# Patient Record
Sex: Female | Born: 1995
Health system: Southern US, Community
[De-identification: ages and names within clinical notes are randomized; demographics above are authoritative.]

## PROBLEM LIST (undated history)

## (undated) ENCOUNTER — Inpatient Hospital Stay (HOSPITAL_COMMUNITY): Payer: Self-pay

## (undated) DIAGNOSIS — J189 Pneumonia, unspecified organism: Secondary | ICD-10-CM

## (undated) DIAGNOSIS — R51 Headache: Secondary | ICD-10-CM

## (undated) DIAGNOSIS — R519 Headache, unspecified: Secondary | ICD-10-CM

## (undated) HISTORY — PX: ORIF TIBIA FRACTURE: SHX5416

## (undated) HISTORY — PX: MOUTH SURGERY: SHX715

---
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
## 2013-02-25 ENCOUNTER — Emergency Department (HOSPITAL_BASED_OUTPATIENT_CLINIC_OR_DEPARTMENT_OTHER)
Admission: EM | Admit: 2013-02-25 | Discharge: 2013-02-26 | Disposition: A | Discharge status: Home / Self Care | Payer: Medicaid Other | Attending: Emergency Medicine | Admitting: Emergency Medicine

## 2013-02-25 ENCOUNTER — Encounter (HOSPITAL_BASED_OUTPATIENT_CLINIC_OR_DEPARTMENT_OTHER): Payer: Self-pay | Admitting: *Deleted

## 2013-02-25 DIAGNOSIS — R51 Headache: Secondary | ICD-10-CM | POA: Insufficient documentation

## 2013-02-25 DIAGNOSIS — R05 Cough: Secondary | ICD-10-CM | POA: Insufficient documentation

## 2013-02-25 DIAGNOSIS — J45909 Unspecified asthma, uncomplicated: Secondary | ICD-10-CM | POA: Insufficient documentation

## 2013-02-25 DIAGNOSIS — Z3202 Encounter for pregnancy test, result negative: Secondary | ICD-10-CM | POA: Insufficient documentation

## 2013-02-25 DIAGNOSIS — R0981 Nasal congestion: Secondary | ICD-10-CM

## 2013-02-25 DIAGNOSIS — B349 Viral infection, unspecified: Secondary | ICD-10-CM

## 2013-02-25 DIAGNOSIS — L509 Urticaria, unspecified: Secondary | ICD-10-CM | POA: Insufficient documentation

## 2013-02-25 DIAGNOSIS — Z79899 Other long term (current) drug therapy: Secondary | ICD-10-CM | POA: Insufficient documentation

## 2013-02-25 DIAGNOSIS — B9789 Other viral agents as the cause of diseases classified elsewhere: Secondary | ICD-10-CM | POA: Insufficient documentation

## 2013-02-25 DIAGNOSIS — J3489 Other specified disorders of nose and nasal sinuses: Secondary | ICD-10-CM | POA: Insufficient documentation

## 2013-02-25 DIAGNOSIS — R509 Fever, unspecified: Secondary | ICD-10-CM | POA: Insufficient documentation

## 2013-02-25 DIAGNOSIS — R093 Abnormal sputum: Secondary | ICD-10-CM | POA: Insufficient documentation

## 2013-02-25 DIAGNOSIS — R059 Cough, unspecified: Secondary | ICD-10-CM | POA: Insufficient documentation

## 2013-02-25 DIAGNOSIS — L299 Pruritus, unspecified: Secondary | ICD-10-CM | POA: Insufficient documentation

## 2013-02-25 LAB — URINALYSIS, ROUTINE W REFLEX MICROSCOPIC
Bilirubin Urine: NEGATIVE
Glucose, UA: NEGATIVE mg/dL
Hgb urine dipstick: NEGATIVE
Ketones, ur: NEGATIVE mg/dL
Leukocytes, UA: NEGATIVE
Nitrite: NEGATIVE
Protein, ur: NEGATIVE mg/dL
Specific Gravity, Urine: 1.023 (ref 1.005–1.030)
Urobilinogen, UA: 1 mg/dL (ref 0.0–1.0)
pH: 7 (ref 5.0–8.0)

## 2013-02-25 MED ORDER — DIPHENHYDRAMINE HCL 25 MG PO CAPS
25.0000 mg | ORAL_CAPSULE | Freq: Once | ORAL | Status: AC
  Administered 2013-02-25: 25 mg via ORAL
  Filled 2013-02-25: qty 1

## 2013-02-25 MED ORDER — IBUPROFEN 800 MG PO TABS: 800.0000 mg | ORAL_TABLET | Freq: Three times a day (TID) | ORAL | Status: DC

## 2013-02-25 MED ORDER — DIPHENHYDRAMINE-ZINC ACETATE 1-0.1 % EX CREA: TOPICAL_CREAM | Freq: Three times a day (TID) | CUTANEOUS | Status: DC | PRN

## 2013-02-25 MED ORDER — LIDOCAINE HCL 2 % EX GEL
Freq: Once | CUTANEOUS | Status: AC
  Administered 2013-02-25: 20 via TOPICAL
  Filled 2013-02-25: qty 20

## 2013-02-25 MED ORDER — IBUPROFEN 800 MG PO TABS
800.0000 mg | ORAL_TABLET | Freq: Once | ORAL | Status: AC
  Administered 2013-02-25: 800 mg via ORAL
  Filled 2013-02-25: qty 1

## 2013-02-25 NOTE — ED Provider Notes (Signed)
History     This chart was scribed for Latasha Skene, MD by Jiles Prows, ED Scribe. The patient was seen in room MH08/MH08 and the patient's care was started at 11:25 PM.    CSN: [id number]  Arrival date & time 02/25/13  2154   Chief Complaint  Patient presents with  . Sore Throat    The history is provided by the patient, medical records, a friend and a relative. No language interpreter was used.   HPI Comments: Latasha Decker is a 17 y.o. female who presents to the Emergency Department complaining of moderate constant sore throat that began two days ago.  She complains of productive cough with dark yellow sputum, headache, and itchy, raised, red "bite" areas on her stomach, legs, and feet.  Pt denies diaphoresis, nausea, vomiting, diarrhea, weakness, and any other pain.    Pt reports last menstrual period was 3 weeks ago. Pt reports smoking blacks (5/week) occasionally.  Past Medical History  Diagnosis Date  . Asthma     Past Surgical History  Procedure Laterality Date  . Orif tibia fracture    . Mouth surgery      Family History  Problem Relation Age of Onset  . Other Neg Hx     History  Substance Use Topics  . Smoking status: Never Smoker   . Smokeless tobacco: Never Used  . Alcohol Use: Yes     Comment: occasionally    OB History   Grav Para Term Preterm Abortions TAB SAB Ect Mult Living                  Review of Systems At least 10pt or greater review of systems completed and are negative except where specified in the HP.  Allergies  Peanuts  Home Medications   Current Outpatient Rx  Name  Route  Sig  Dispense  Refill  . ciprofloxacin (CIPRO) 500 MG tablet   Oral   Take 1 tablet (500 mg total) by mouth 2 (two) times daily.   3 tablet   0   . hydrocortisone cream 1 %      Apply to affected areas 3 times daily, for no more than 1 week at a time   15 g   0   . ibuprofen (ADVIL,MOTRIN) 800 MG tablet   Oral   Take 1 tablet (800 mg total) by  mouth 3 (three) times daily.   21 tablet   0   . phentermine 37.5 MG capsule   Oral   Take 37.5 mg by mouth every morning.         . pseudoephedrine-guaifenesin (MUCINEX D) 60-600 MG per tablet   Oral   Take 1 tablet by mouth every 12 (twelve) hours.           BP 138/65  Pulse 142  Temp(Src) 100 F (37.8 C) (Oral)  Resp 20  Ht 5\' 3"  (1.6 m)  Wt 370 lb (167.831 kg)  BMI 65.56 kg/m2  SpO2 99%  LMP 02/04/2013  Physical Exam  Nursing notes reviewed.  Electronic medical record reviewed. VITAL SIGNS:   Filed Vitals:   02/25/13 2201  BP: 138/65  Pulse: 142  Temp: 100 F (37.8 C)  TempSrc: Oral  Resp: 20  Height: 5\' 3"  (1.6 m)  Weight: 370 lb (167.831 kg)  SpO2: 99%   CONSTITUTIONAL: Awake, oriented, appears non-toxic HENT: Atraumatic, normocephalic, oral mucosa pink and moist, airway patent. Pharynx is without exudate or erythema, no swelling noted.  Nares  patent without thin clear drainage some mild turbinate swelling. External ears normal. EYES: Conjunctiva clear, EOMI, PERRLA NECK: Trachea midline, non-tender, supple CARDIOVASCULAR: Normal heart rate, Normal rhythm, No murmurs, rubs, gallops PULMONARY/CHEST: Clear to auscultation, no rhonchi, wheezes, or rales. Symmetrical breath sounds. Non-tender. ABDOMINAL: Non-distended, morbidly obese soft, non-tender - no rebound or guarding.  BS normal. NEUROLOGIC: Non-focal, moving all four extremities, no gross sensory or motor deficits. EXTREMITIES: No clubbing, cyanosis, or edema SKIN: Warm, Dry, No erythema. Patient has occasional scattered occurred O. we'll's some on the medial aspect of the right foot, some on the abdomen, these are $0.50 piece in diameter, raised macular areas they're pruritic.  ED Course  Procedures (including critical care time) DIAGNOSTIC STUDIES: Oxygen Saturation is 99% on RA, normal by my interpretation.    COORDINATION OF CARE: 11:34 PM - Discussed ED treatment with pt at bedside and pt  agrees.     Labs Reviewed  RAPID STREP SCREEN  CULTURE, GROUP A STREP  PREGNANCY, URINE  URINALYSIS, ROUTINE W REFLEX MICROSCOPIC   No results found.   1. Viral syndrome   2. Fever   3. Cough   4. Rhinorrhea   5. Nasal congestion   6. Urticaria       MDM  Patient presenting with viral syndrome and there is urticarial wheals. Patient is febrile here, she is morbidly obese, patient is a Wells score of 1 for tachycardia, but she's not short of breath not complaining about chest pain-only a cough. Do not think her tachycardia as result of pulmonary embolism or any other intrathoracic or intra-abdominal emergency at this time. I think her tachycardia secondary to her obesity and fever.  No evidence for RPA or PTA, strep swab is negative.  Patient with viral syndrome with evidence for rhinorrhea, congestion. Treat patient symptomatically.  I personally performed the services described in this documentation, which was scribed in my presence. The recorded information has been reviewed and is accurate. Latasha Decker, M.D.     Latasha Skene, MD 02/26/13 3086

## 2013-02-25 NOTE — ED Notes (Signed)
Pt c/o H/A. Sore throat, fever and chills x 2 days.

## 2013-02-26 NOTE — ED Notes (Addendum)
rx x 2 for benadryl cream and ibuprofen given

## 2013-02-28 LAB — CULTURE, GROUP A STREP

## 2013-03-01 NOTE — Progress Notes (Signed)
  ED Antimicrobial Stewardship Positive Culture Follow Up   Latasha Decker is an 17 y.o. female who presented to [facility] on 02/25/2013 with a chief complaint of sore throat, fever, chills x 2 days.  Chief Complaint  Patient presents with  . Sore Throat    Recent Results (from the past 720 hour(s))  RAPID STREP SCREEN     Status: None   Collection Time    02/25/13 10:07 PM      Result Value Range Status   Streptococcus, Group A Screen (Direct) NEGATIVE  NEGATIVE Final   Comment: (NOTE)     A Rapid Antigen test may result negative if the antigen level in the     sample is below the detection level of this test. The FDA has not     cleared this test as a stand-alone test therefore the rapid antigen     negative result has reflexed to a Group A Strep culture.  CULTURE, GROUP A STREP     Status: None   Collection Time    02/25/13 10:07 PM      Result Value Range Status   Specimen Description THROAT   Final   Special Requests NONE   Final   Culture STREPTOCOCCUS,BETA HEMOLYIC NOT GROUP A   Final   Report Status 02/28/2013 FINAL   Final    []  Treated with , organism resistant to prescribed antimicrobial [x]  Patient discharged originally without antimicrobial agent and treatment is now indicated  New antibiotic prescription: Penicillin VK 500mg  TID PO TID x 10 days  ED Provider: Jaynie Crumble, PA-C   Cleon Dew 03/01/2013, 10:47 AM Infectious Diseases Pharmacist Phone# [phone]

## 2013-03-01 NOTE — ED Notes (Addendum)
Post ED Visit - Positive Culture Follow-up: Successful Patient Follow-Up  Culture assessed and recommendations reviewed by: [x]  Wes Dulaney, Pharm.D., BCPS []  Celedonio Miyamoto, Pharm.D., BCPS []  Georgina Pillion, [address].D., BCPS []  [place], [address].D., BCPS, AAHIVP []  Estella Husk, Pharm.D., BCPS, AAHIVP  Positive group a strep culture  [x]  Patient discharged without antimicrobial prescription and treatment is now indicated []  Organism is resistant to prescribed ED discharge antimicrobial []  Patient with positive blood cultures  Changes discussed with ED provider: Jaynie Crumble New antibiotic prescription Penicillin VK 500 mg po TID x 10 days  Patient  Informed of positve results,rx called to [facility] [phone] by Sheralyn Boatman PFM     Larena Sox 03/01/2013, 12:20 PM

## 2013-03-02 ENCOUNTER — Telehealth (HOSPITAL_COMMUNITY): Payer: Self-pay | Admitting: Emergency Medicine

## 2013-03-07 ENCOUNTER — Emergency Department (HOSPITAL_BASED_OUTPATIENT_CLINIC_OR_DEPARTMENT_OTHER)
Admission: EM | Admit: 2013-03-07 | Discharge: 2013-03-07 | Disposition: A | Discharge status: Home / Self Care | Payer: Medicaid Other | Attending: Emergency Medicine | Admitting: Emergency Medicine

## 2013-03-07 ENCOUNTER — Encounter (HOSPITAL_BASED_OUTPATIENT_CLINIC_OR_DEPARTMENT_OTHER): Payer: Self-pay

## 2013-03-07 DIAGNOSIS — F17200 Nicotine dependence, unspecified, uncomplicated: Secondary | ICD-10-CM | POA: Insufficient documentation

## 2013-03-07 DIAGNOSIS — Z8781 Personal history of (healed) traumatic fracture: Secondary | ICD-10-CM | POA: Insufficient documentation

## 2013-03-07 DIAGNOSIS — Z792 Long term (current) use of antibiotics: Secondary | ICD-10-CM | POA: Insufficient documentation

## 2013-03-07 DIAGNOSIS — R21 Rash and other nonspecific skin eruption: Secondary | ICD-10-CM

## 2013-03-07 DIAGNOSIS — Z79899 Other long term (current) drug therapy: Secondary | ICD-10-CM | POA: Insufficient documentation

## 2013-03-07 DIAGNOSIS — J45909 Unspecified asthma, uncomplicated: Secondary | ICD-10-CM | POA: Insufficient documentation

## 2013-03-07 HISTORY — DX: Morbid (severe) obesity due to excess calories: E66.01

## 2013-03-07 MED ORDER — HYDROXYZINE HCL 25 MG PO TABS: 25.0000 mg | ORAL_TABLET | Freq: Four times a day (QID) | ORAL | Status: DC | PRN

## 2013-03-07 NOTE — ED Provider Notes (Signed)
History   CSN: [id number] Arrival date & time 03/07/13  2143 First MD Initiated Contact with Patient 03/07/13 2238      Chief Complaint  Patient presents with  . Urticaria    HPI The patient has noticed a rash on her left arm ongoing for last few days. Patient states she is not sure she is getting bitten by some type of bug at night. No one else in the family has a similar rash. She has not seen any bugs. Patient denies any fevers chills cough vomiting or diarrhea. She has not noticed any tick bites. She's not had any difficulty breathing. Past Medical History  Diagnosis Date  . Asthma   . Morbid obesity     Past Surgical History  Procedure Laterality Date  . Orif tibia fracture    . Mouth surgery      Family History  Problem Relation Age of Onset  . Other Neg Hx     History  Substance Use Topics  . Smoking status: Current Some Day Smoker -- 0.50 packs/day  . Smokeless tobacco: Never Used  . Alcohol Use: Yes     Comment: occasionally    OB History   Grav Para Term Preterm Abortions TAB SAB Ect Mult Living                  Review of Systems  All other systems reviewed and are negative.    Allergies  Peanuts  Home Medications   Current Outpatient Rx  Name  Route  Sig  Dispense  Refill  . ciprofloxacin (CIPRO) 500 MG tablet   Oral   Take 1 tablet (500 mg total) by mouth 2 (two) times daily.   3 tablet   0   . diphenhydrAMINE-zinc acetate (BENADRYL) cream   Topical   Apply topically 3 (three) times daily as needed for itching.   28.3 g   0   . hydrocortisone cream 1 %      Apply to affected areas 3 times daily, for no more than 1 week at a time   15 g   0   . hydrOXYzine (ATARAX/VISTARIL) 25 MG tablet   Oral   Take 1 tablet (25 mg total) by mouth every 6 (six) hours as needed for itching.   30 tablet   0   . ibuprofen (ADVIL,MOTRIN) 800 MG tablet   Oral   Take 1 tablet (800 mg total) by mouth 3 (three) times daily.   21 tablet   0   .  ibuprofen (ADVIL,MOTRIN) 800 MG tablet   Oral   Take 1 tablet (800 mg total) by mouth 3 (three) times daily.   21 tablet   0   . phentermine 37.5 MG capsule   Oral   Take 37.5 mg by mouth every morning.         . pseudoephedrine-guaifenesin (MUCINEX D) 60-600 MG per tablet   Oral   Take 1 tablet by mouth every 12 (twelve) hours.           BP 127/60  Pulse 93  Temp(Src) 97.8 F (36.6 C) (Oral)  Resp 18  Ht 5\' 4"  (1.626 m)  Wt 370 lb (167.831 kg)  BMI 63.48 kg/m2  SpO2 100%  LMP 02/04/2013  Physical Exam  Nursing note and vitals reviewed. Constitutional: She appears well-developed and well-nourished. No distress.  HENT:  Head: Normocephalic and atraumatic.  Right Ear: External ear normal.  Left Ear: External ear normal.  Eyes: Conjunctivae are  normal. Right eye exhibits no discharge. Left eye exhibits no discharge. No scleral icterus.  Neck: Neck supple. No tracheal deviation present.  Cardiovascular: Normal rate.   Pulmonary/Chest: Effort normal. No stridor. No respiratory distress.  Musculoskeletal: She exhibits no edema.  Neurological: She is alert. Cranial nerve deficit: no gross deficits.  Skin: Skin is warm and dry. No rash noted.  Several small areas of erythema and induration in her left arm, no fluctuance, no tenderness, small areas of excoriation, central scab/papule  Psychiatric: She has a normal mood and affect.    ED Course  Procedures (including critical care time)  Labs Reviewed - No data to display No results found.   1. Rash       MDM  Patient's lesions do look like insect bites. It's possibly could be bedbugs. I recommend she checked her house. I will discharge her home on antihistamines.        Celene Kras, MD 03/07/13 [phone]

## 2013-03-07 NOTE — ED Notes (Signed)
C/o hives/?bug bites to left arm x 2 days

## 2013-07-18 ENCOUNTER — Emergency Department (HOSPITAL_BASED_OUTPATIENT_CLINIC_OR_DEPARTMENT_OTHER)
Admission: EM | Admit: 2013-07-18 | Discharge: 2013-07-19 | Discharge status: Against Medical Advice | Payer: Medicaid Other | Attending: Emergency Medicine | Admitting: Emergency Medicine

## 2013-07-18 ENCOUNTER — Encounter (HOSPITAL_BASED_OUTPATIENT_CLINIC_OR_DEPARTMENT_OTHER): Payer: Self-pay | Admitting: Emergency Medicine

## 2013-07-18 DIAGNOSIS — R110 Nausea: Secondary | ICD-10-CM | POA: Insufficient documentation

## 2013-07-18 DIAGNOSIS — R51 Headache: Secondary | ICD-10-CM | POA: Insufficient documentation

## 2013-07-18 DIAGNOSIS — R509 Fever, unspecified: Secondary | ICD-10-CM | POA: Insufficient documentation

## 2013-07-18 MED ORDER — ACETAMINOPHEN 325 MG PO TABS
975.0000 mg | ORAL_TABLET | Freq: Once | ORAL | Status: AC
  Administered 2013-07-18: 975 mg via ORAL
  Filled 2013-07-18: qty 3

## 2013-07-18 NOTE — ED Notes (Signed)
HA started 2 days ago.  Fever since yesterday.  Nausea today with no vomiting.  Student at [employer].

## 2013-07-18 NOTE — ED Notes (Signed)
Pt requested to speak with Triage RN; this RN spoke with patient, rechecking VS. Pt states she wants to leave. Informed pt that her VS are unchanged and not WNL after receiving Tylenol; made pt aware of risks, including, but not limiting,  the possibility of pneumonia, Virus, PE. Pt sts that she will go back to lobby and speak to her mother about leaving. Encouraged pt to stay, and that if she didn't to return to ED or call EMS if any s/s worsen. Upon my assessment, pt sts she has nausea and chills; denies pain, vomiting, diarrhea, abnormal menses, pregnancy, headache, sore throat. She sts that she has had chills x 3days and brief period of shob while showering in her dorm; roommate suggested contacting parent whom brought her to ED. Pt also denies any s/s or illness of rommate.

## 2013-07-19 ENCOUNTER — Encounter (HOSPITAL_BASED_OUTPATIENT_CLINIC_OR_DEPARTMENT_OTHER): Payer: Self-pay | Admitting: Emergency Medicine

## 2013-07-19 ENCOUNTER — Emergency Department (HOSPITAL_BASED_OUTPATIENT_CLINIC_OR_DEPARTMENT_OTHER)
Admission: EM | Admit: 2013-07-19 | Discharge: 2013-07-19 | Disposition: A | Discharge status: Home / Self Care | Payer: Medicaid Other | Attending: Emergency Medicine | Admitting: Emergency Medicine

## 2013-07-19 DIAGNOSIS — F17200 Nicotine dependence, unspecified, uncomplicated: Secondary | ICD-10-CM | POA: Insufficient documentation

## 2013-07-19 DIAGNOSIS — Z792 Long term (current) use of antibiotics: Secondary | ICD-10-CM | POA: Insufficient documentation

## 2013-07-19 DIAGNOSIS — Z3202 Encounter for pregnancy test, result negative: Secondary | ICD-10-CM | POA: Insufficient documentation

## 2013-07-19 DIAGNOSIS — Z791 Long term (current) use of non-steroidal anti-inflammatories (NSAID): Secondary | ICD-10-CM | POA: Insufficient documentation

## 2013-07-19 DIAGNOSIS — J45909 Unspecified asthma, uncomplicated: Secondary | ICD-10-CM | POA: Insufficient documentation

## 2013-07-19 DIAGNOSIS — Z79899 Other long term (current) drug therapy: Secondary | ICD-10-CM | POA: Insufficient documentation

## 2013-07-19 DIAGNOSIS — R509 Fever, unspecified: Secondary | ICD-10-CM

## 2013-07-19 LAB — URINALYSIS, ROUTINE W REFLEX MICROSCOPIC
Glucose, UA: NEGATIVE mg/dL
Hgb urine dipstick: NEGATIVE
Specific Gravity, Urine: 1.03 (ref 1.005–1.030)

## 2013-07-19 LAB — URINE MICROSCOPIC-ADD ON

## 2013-07-19 LAB — PREGNANCY, URINE: Preg Test, Ur: NEGATIVE

## 2013-07-19 NOTE — ED Notes (Signed)
Pt c/o fever x3 days 

## 2013-07-19 NOTE — ED Notes (Signed)
Pt asked twice during assessment to have "something to eat or drink" she was advised until results are back and EDP advises it's ok she has to remain NPO

## 2013-07-19 NOTE — ED Provider Notes (Addendum)
CSN: [id number]     Arrival date & time 07/19/13  1828 History   First MD Initiated Contact with Patient 07/19/13 1843     Chief Complaint  Patient presents with  . Fever   (Consider location/radiation/quality/duration/timing/severity/associated sxs/prior Treatment) HPI Comments: Patient presents with a fever. She states the fevers and on for the last 3 days. She states yesterday was at 25. She felt she had a fever this morning and took ibuprofen for the fever this morning but hasn't taken anything since about 8:00 this morning. She feels that she just wanted to get checked out to make sure was anything serious. She's had some intermittent mild headaches. She denies any cough or nasal congestion. She denies any sore throat. She denies abdominal pain. She denies he nausea vomiting or diarrhea. She denies urinary complaints. She denies any sores or rashes. She denies any known sick contacts. She has some mild myalgias but denies any currently.  Patient is a 17 y.o. female presenting with fever.  Fever Associated symptoms: no chest pain, no chills, no congestion, no cough, no diarrhea, no headaches, no nausea, no rash, no rhinorrhea and no vomiting     Past Medical History  Diagnosis Date  . Asthma   . Morbid obesity    Past Surgical History  Procedure Laterality Date  . Orif tibia fracture    . Mouth surgery     Family History  Problem Relation Age of Onset  . Other Neg Hx    History  Substance Use Topics  . Smoking status: Current Some Day Smoker    Types: Cigars  . Smokeless tobacco: Never Used  . Alcohol Use: Yes     Comment: occasionally   OB History   Grav Para Term Preterm Abortions TAB SAB Ect Mult Living                 Review of Systems  Constitutional: Positive for fever. Negative for chills, diaphoresis and fatigue.  HENT: Negative for congestion, rhinorrhea and sneezing.   Eyes: Negative.   Respiratory: Negative for cough, chest tightness and shortness of  breath.   Cardiovascular: Negative for chest pain and leg swelling.  Gastrointestinal: Negative for nausea, vomiting, abdominal pain, diarrhea and blood in stool.  Genitourinary: Negative for frequency, hematuria, flank pain and difficulty urinating.  Musculoskeletal: Negative for arthralgias and back pain.  Skin: Negative for rash.  Neurological: Negative for dizziness, speech difficulty, weakness, numbness and headaches.    Allergies  Peanuts  Home Medications   Current Outpatient Rx  Name  Route  Sig  Dispense  Refill  . ciprofloxacin (CIPRO) 500 MG tablet   Oral   Take 1 tablet (500 mg total) by mouth 2 (two) times daily.   3 tablet   0   . diphenhydrAMINE-zinc acetate (BENADRYL) cream   Topical   Apply topically 3 (three) times daily as needed for itching.   28.3 g   0   . hydrocortisone cream 1 %      Apply to affected areas 3 times daily, for no more than 1 week at a time   15 g   0   . hydrOXYzine (ATARAX/VISTARIL) 25 MG tablet   Oral   Take 1 tablet (25 mg total) by mouth every 6 (six) hours as needed for itching.   30 tablet   0   . ibuprofen (ADVIL,MOTRIN) 800 MG tablet   Oral   Take 1 tablet (800 mg total) by mouth 3 (three) times  daily.   21 tablet   0   . ibuprofen (ADVIL,MOTRIN) 800 MG tablet   Oral   Take 1 tablet (800 mg total) by mouth 3 (three) times daily.   21 tablet   0   . phentermine 37.5 MG capsule   Oral   Take 37.5 mg by mouth every morning.         . pseudoephedrine-guaifenesin (MUCINEX D) 60-600 MG per tablet   Oral   Take 1 tablet by mouth every 12 (twelve) hours.          BP 117/70  Pulse 80  Temp(Src) 98.7 F (37.1 C) (Oral)  Resp 16  Ht 5\' 3"  (1.6 m)  Wt 350 lb (158.759 kg)  BMI 62.02 kg/m2  SpO2 97%  LMP 06/18/2013 Physical Exam  Constitutional: She is oriented to person, place, and time. She appears well-developed and well-nourished.  Patient is sitting up in bed smiling and laughing with her friends. She  is morbidly obese  HENT:  Head: Normocephalic and atraumatic.  Right Ear: External ear normal.  Left Ear: External ear normal.  Mouth/Throat: Oropharynx is clear and moist.  Eyes: Pupils are equal, round, and reactive to light.  Neck: Normal range of motion. Neck supple.  Cardiovascular: Normal rate, regular rhythm and normal heart sounds.   Pulmonary/Chest: Effort normal and breath sounds normal. No respiratory distress. She has no wheezes. She has no rales. She exhibits no tenderness.  Abdominal: Soft. Bowel sounds are normal. There is no tenderness. There is no rebound and no guarding.  Musculoskeletal: Normal range of motion. She exhibits no edema.  Lymphadenopathy:    She has no cervical adenopathy.  Neurological: She is alert and oriented to person, place, and time.  Skin: Skin is warm and dry. No rash noted.  No wounds or abscess is noted  Psychiatric: She has a normal mood and affect.    ED Course  Procedures (including critical care time) Labs Review Labs Reviewed  URINALYSIS, ROUTINE W REFLEX MICROSCOPIC - Abnormal; Notable for the following:    APPearance CLOUDY (*)    Bilirubin Urine SMALL (*)    Ketones, ur 15 (*)    Protein, ur 30 (*)    All other components within normal limits  URINE MICROSCOPIC-ADD ON - Abnormal; Notable for the following:    Squamous Epithelial / LPF MANY (*)    All other components within normal limits  PREGNANCY, URINE   Imaging Review No results found.  EKG Interpretation   None       MDM   1. Fever    Patient is well-appearing. Has not had a fever since early this morning. Has not taken any medication since earlier this morning. Her symptoms are likely viral in origin. She was discharged home and advised to follow up with her primary care physician or return here for symptoms worsen.    Rolan Bucco, MD 07/19/13 2250  Rolan Bucco, MD 07/19/13 2251

## 2014-01-03 IMAGING — US US TRANSVAGINAL NON-OB
1 series · 14 of 25 positions shown · non-contrast
Comparison: None.

CLINICAL DATA: Abnormal vaginal bleeding.

TRANSABDOMINAL AND TRANSVAGINAL ULTRASOUND OF PELVIS
TECHNIQUE: Both transabdominal and transvaginal ultrasound
examinations of the pelvis were performed including evaluation of
the uterus, ovaries, adnexal regions, and pelvic cul-de-sac.

[Series 1: us transvaginal non-ob · 0.26mm/px · 14 of 63 slices shown]
[im 1/63]
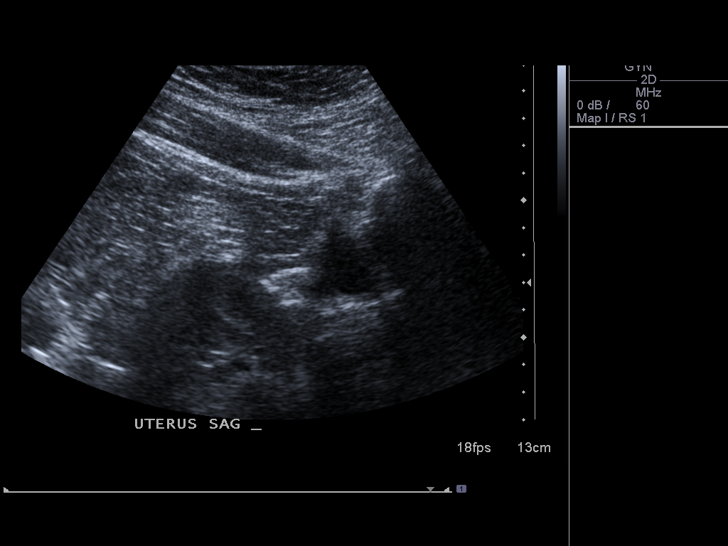
[im 6/63]
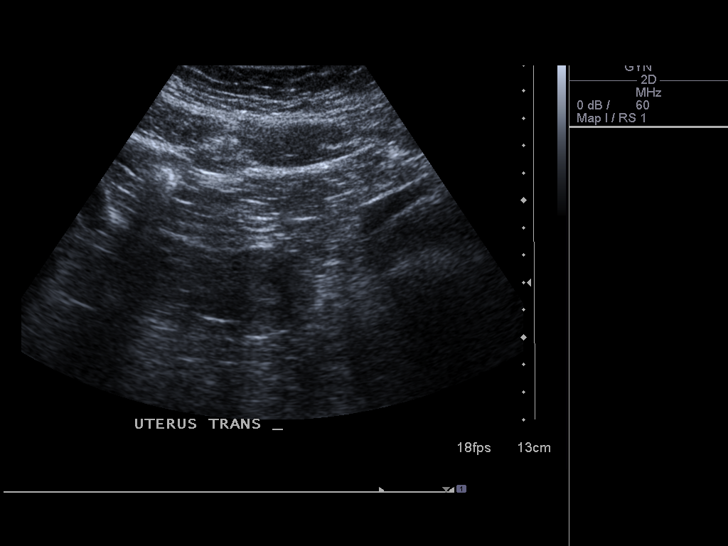
[im 11/63]
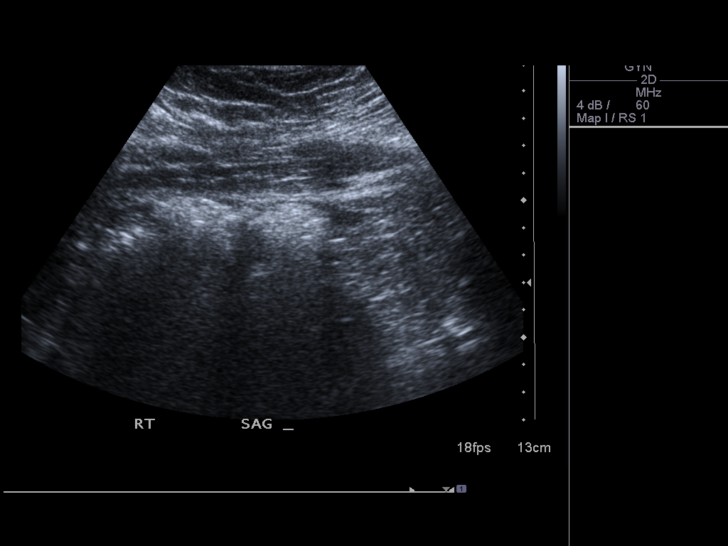
[im 16/63]
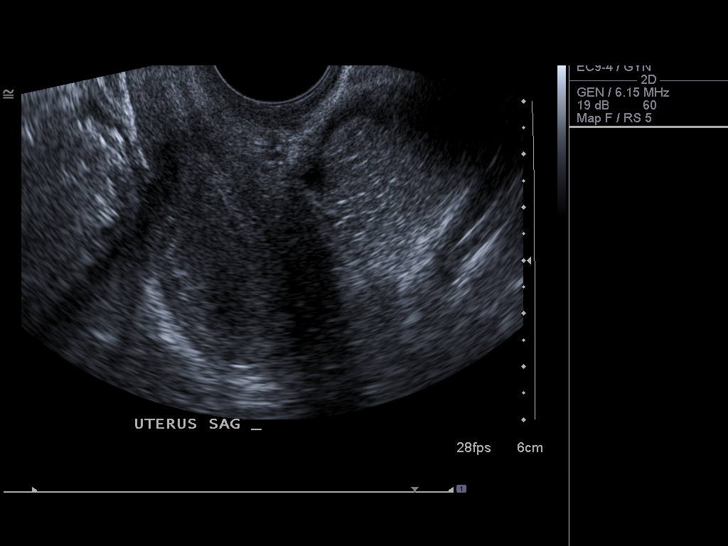
[im 21/63]
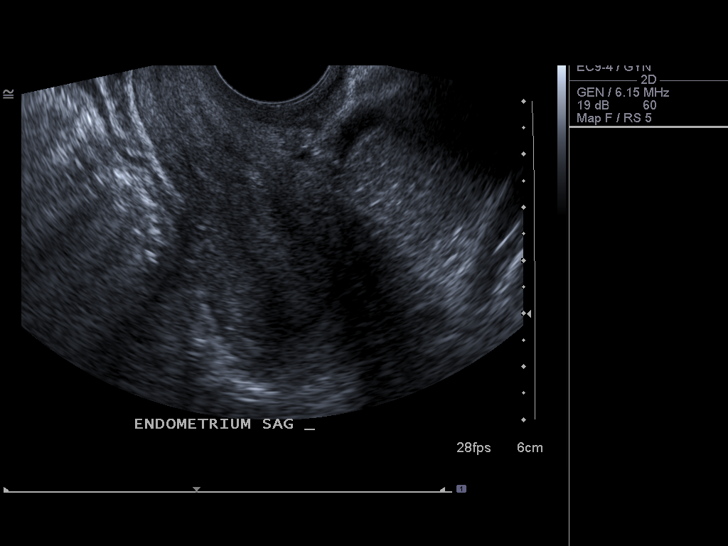
[im 24/63]
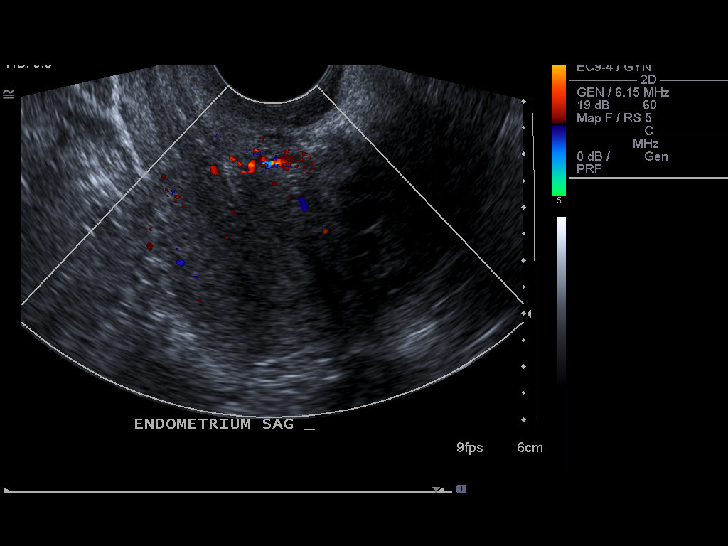
[im 29/63]
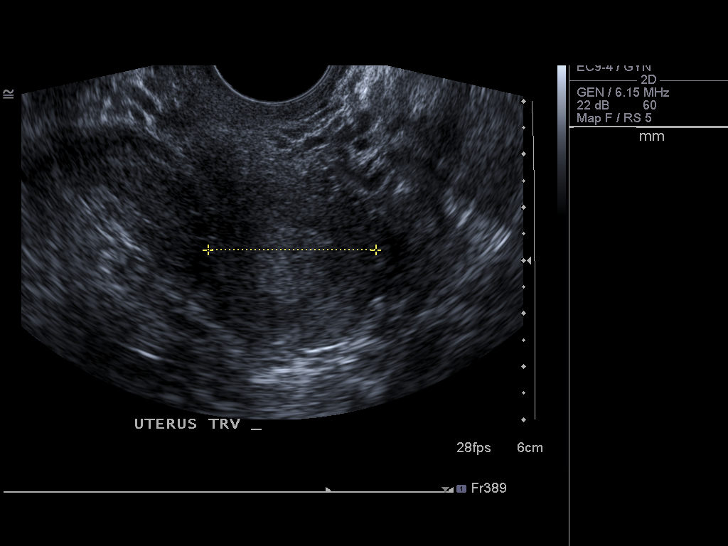
[im 34/63]
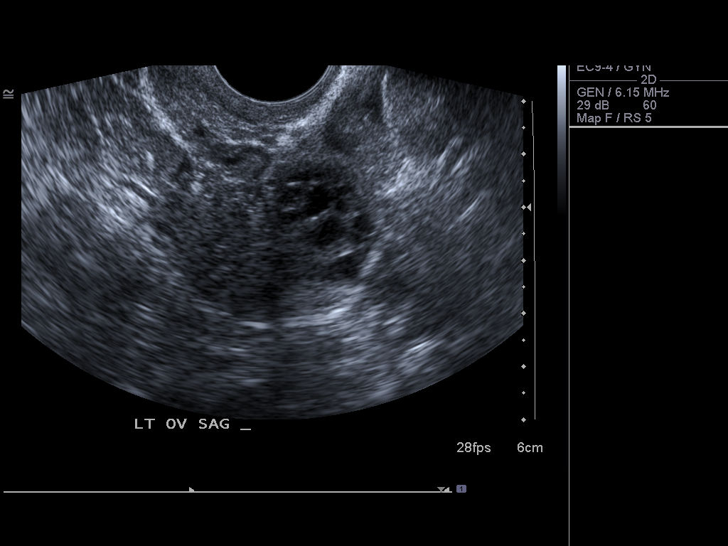
[im 39/63]
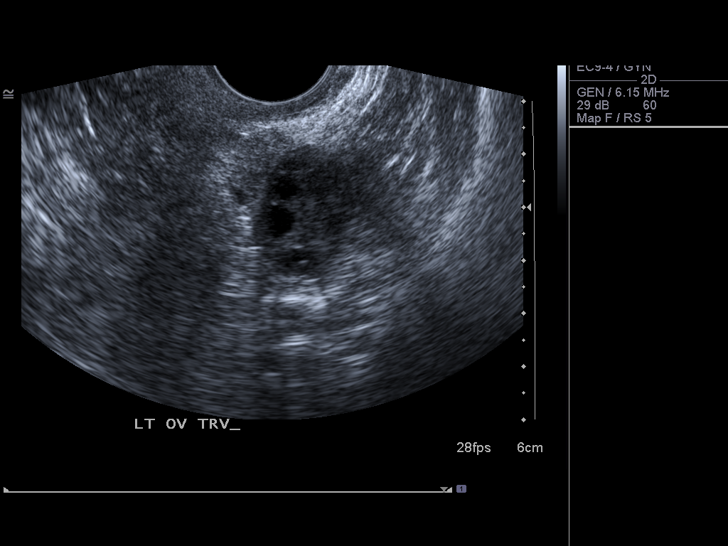
[im 42/63]
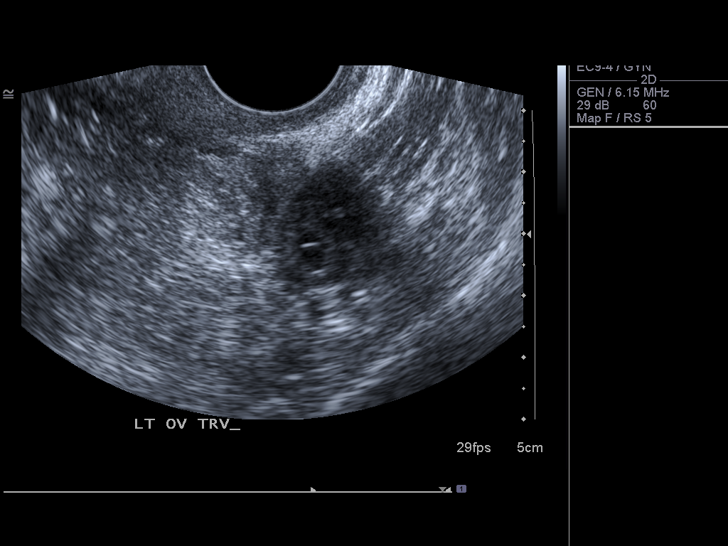
[im 47/63]
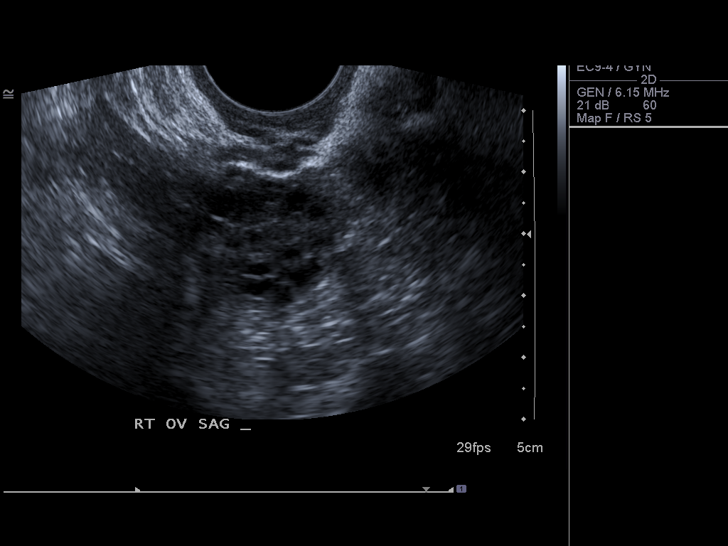
[im 52/63]
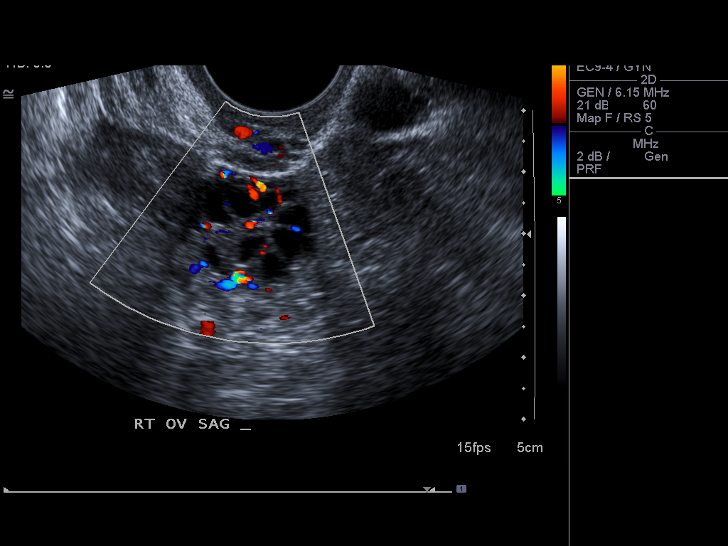
[im 57/63]
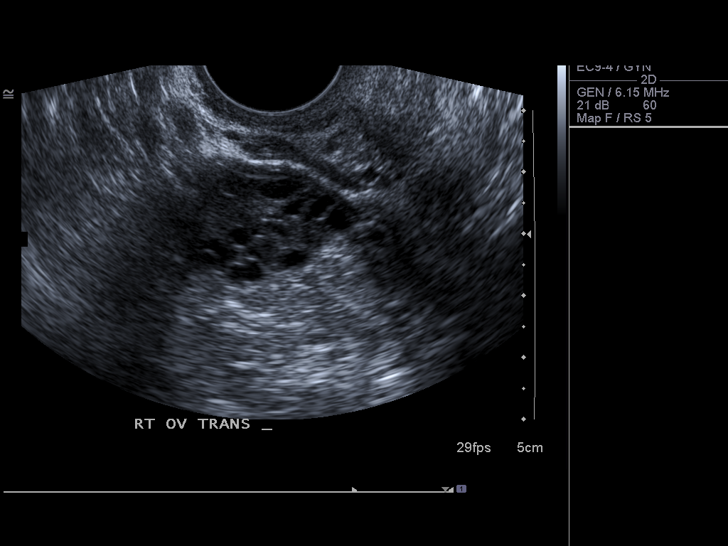
[im 63/63]
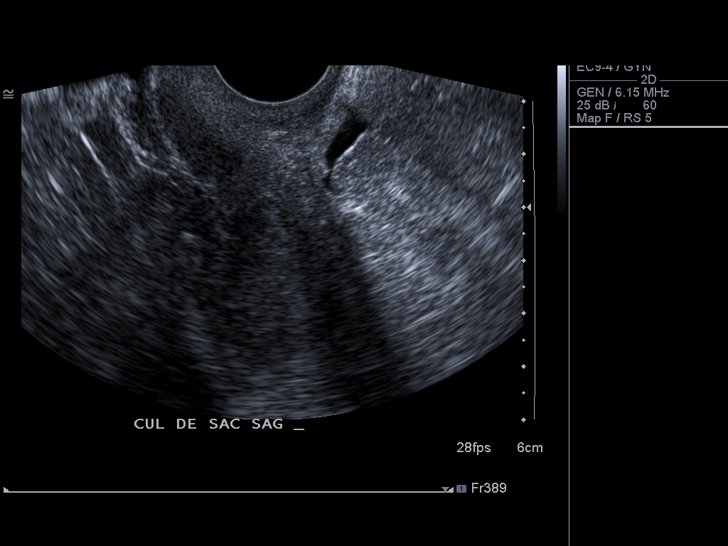

[14 of 25 positions shown; findings below may reference images not displayed]

FINDINGS: Uterus: 4.5 x 2.5 x 3.5 cm.  No focal abnormality.  Normal
echotexture. Uterus is retroverted.

Endometrium: Normal appearance and thickness, 1 mm.

Right Ovary: 2.7 x 2.0 x 2.5 cm.  Multiple small follicles.

Left Ovary: [2.4 x 2.2 x 1.7 cm.  Multiple small follicles.

Other Findings:  Trace free fluid in the pelvis.
IMPRESSION: Unremarkable study.

## 2014-05-04 ENCOUNTER — Encounter (HOSPITAL_BASED_OUTPATIENT_CLINIC_OR_DEPARTMENT_OTHER): Payer: Self-pay | Admitting: Emergency Medicine

## 2014-05-04 ENCOUNTER — Emergency Department (HOSPITAL_BASED_OUTPATIENT_CLINIC_OR_DEPARTMENT_OTHER)
Admission: EM | Admit: 2014-05-04 | Discharge: 2014-05-04 | Disposition: A | Discharge status: Home / Self Care | Payer: Medicaid Other | Attending: Emergency Medicine | Admitting: Emergency Medicine

## 2014-05-04 DIAGNOSIS — F17200 Nicotine dependence, unspecified, uncomplicated: Secondary | ICD-10-CM | POA: Insufficient documentation

## 2014-05-04 DIAGNOSIS — Z791 Long term (current) use of non-steroidal anti-inflammatories (NSAID): Secondary | ICD-10-CM | POA: Insufficient documentation

## 2014-05-04 DIAGNOSIS — J45909 Unspecified asthma, uncomplicated: Secondary | ICD-10-CM | POA: Diagnosis not present

## 2014-05-04 DIAGNOSIS — R599 Enlarged lymph nodes, unspecified: Secondary | ICD-10-CM | POA: Insufficient documentation

## 2014-05-04 DIAGNOSIS — J029 Acute pharyngitis, unspecified: Secondary | ICD-10-CM | POA: Diagnosis present

## 2014-05-04 LAB — RAPID STREP SCREEN (MED CTR MEBANE ONLY): STREPTOCOCCUS, GROUP A SCREEN (DIRECT): NEGATIVE

## 2014-05-04 MED ORDER — IBUPROFEN 800 MG PO TABS: 800.0000 mg | ORAL_TABLET | Freq: Three times a day (TID) | ORAL | Status: DC

## 2014-05-04 MED ORDER — DEXAMETHASONE SODIUM PHOSPHATE 10 MG/ML IJ SOLN
10.0000 mg | Freq: Once | INTRAMUSCULAR | Status: AC
  Administered 2014-05-04: 10 mg via INTRAMUSCULAR
  Filled 2014-05-04: qty 1

## 2014-05-04 NOTE — ED Provider Notes (Signed)
History/physical exam/procedure(s) were performed by non-physician practitioner and as supervising physician I was immediately available for consultation/collaboration. I have reviewed all notes and am in agreement with care and plan.   Nikcole Eischeid S Nitya Cauthon, MD 05/04/14 2306 

## 2014-05-04 NOTE — ED Provider Notes (Signed)
CSN: [id number]     Arrival date & time 05/04/14  2053 History   First MD Initiated Contact with Patient 05/04/14 2101     Chief Complaint  Patient presents with  . Sore Throat     (Consider location/radiation/quality/duration/timing/severity/associated sxs/prior Treatment) HPI Comments: Patient is a 18 year old morbidly obese female with a past medical history of asthma who presents to the emergency department complaining of sore throat x5 days. Patient reports her throat feels swollen along with her neck. She has some pain with swallowing. No aggravating or alleviating factors. States she has been occasionally coughing up green mucus and has nasal congestion. Denies fever or chills. No sick contacts.  Patient is a 18 y.o. female presenting with pharyngitis. The history is provided by the patient.  Sore Throat Associated symptoms include a sore throat.    Past Medical History  Diagnosis Date  . Asthma   . Morbid obesity    Past Surgical History  Procedure Laterality Date  . Orif tibia fracture    . Mouth surgery     Family History  Problem Relation Age of Onset  . Other Neg Hx    History  Substance Use Topics  . Smoking status: Current Some Day Smoker    Types: Cigars  . Smokeless tobacco: Never Used  . Alcohol Use: Yes     Comment: occasionally   OB History   Grav Para Term Preterm Abortions TAB SAB Ect Mult Living                 Review of Systems  HENT: Positive for sore throat.   Hematological: Positive for adenopathy.  All other systems reviewed and are negative.     Allergies  Peanuts  Home Medications   Prior to Admission medications   Medication Sig Start Date End Date Taking? Authorizing Provider  ibuprofen (ADVIL,MOTRIN) 800 MG tablet Take 1 tablet (800 mg total) by mouth 3 (three) times daily. 02/25/13   John-Adam Bonk, MD  ibuprofen (ADVIL,MOTRIN) 800 MG tablet Take 1 tablet (800 mg total) by mouth 3 (three) times daily. 05/04/14   Trevor Maceobyn M Albert,  PA-C   BP 127/81  Pulse 97  Temp(Src) 98.1 F (36.7 C) (Oral)  Resp 24  Ht 5\' 4"  (1.626 m)  Wt 378 lb (171.46 kg)  BMI 64.85 kg/m2  SpO2 98% Physical Exam  Nursing note and vitals reviewed. Constitutional: She is oriented to person, place, and time. She appears well-developed and well-nourished. No distress.  Morbidly obese.  HENT:  Head: Normocephalic and atraumatic.  Tonsils enlarged and inflamed +3 bilateral with exudate. No tonsillar abscess.  Eyes: Conjunctivae are normal.  Neck: Normal range of motion. Neck supple.  Cardiovascular: Normal rate, regular rhythm and normal heart sounds.   Pulmonary/Chest: Effort normal and breath sounds normal.  Musculoskeletal: Normal range of motion. She exhibits no edema.  Lymphadenopathy:       Head (right side): Tonsillar adenopathy present.       Head (left side): Tonsillar adenopathy present.    She has cervical adenopathy.       Right cervical: Superficial cervical adenopathy present.       Left cervical: Superficial cervical adenopathy present.  Neurological: She is alert and oriented to person, place, and time.  Skin: Skin is warm and dry. She is not diaphoretic.  Psychiatric: She has a normal mood and affect. Her behavior is normal.    ED Course  Procedures (including critical care time) Labs Review Labs Reviewed  RAPID  STREP SCREEN  CULTURE, GROUP A STREP    Imaging Review No results found.   EKG Interpretation None      MDM   Final diagnoses:  Exudative pharyngitis   Patient presenting with sore throat. She is nontoxic appearing and in no apparent distress. Afebrile, vital signs stable. Rapid strep negative. Tonsils enlarged and inflamed +3 with exudate. Swallow secretions well. No tonsillar abscess. Given patient afebrile, associated cough and negative strep, I do not feel treatment of antibiotics is necessary at this time. Decadron given in the emergency department for tonsillar swelling, discharge with  ibuprofen, advised saltwater gargles. Return precautions given. Patient states understanding of treatment care plan and is agreeable.  Trevor Mace, PA-C 05/04/14 2157

## 2014-05-04 NOTE — ED Notes (Signed)
sore throat x 1 week.

## 2014-05-04 NOTE — Discharge Instructions (Signed)
Take ibuprofen as directed for pain and swelling. Use salt-water gargles.  Pharyngitis Pharyngitis is redness, pain, and swelling (inflammation) of your pharynx.  CAUSES  Pharyngitis is usually caused by infection. Most of the time, these infections are from viruses (viral) and are part of a cold. However, sometimes pharyngitis is caused by bacteria (bacterial). Pharyngitis can also be caused by allergies. Viral pharyngitis may be spread from person to person by coughing, sneezing, and personal items or utensils (cups, forks, spoons, toothbrushes). Bacterial pharyngitis may be spread from person to person by more intimate contact, such as kissing.  SIGNS AND SYMPTOMS  Symptoms of pharyngitis include:   Sore throat.   Tiredness (fatigue).   Low-grade fever.   Headache.  Joint pain and muscle aches.  Skin rashes.  Swollen lymph nodes.  Plaque-like film on throat or tonsils (often seen with bacterial pharyngitis). DIAGNOSIS  Your health care provider will ask you questions about your illness and your symptoms. Your medical history, along with a physical exam, is often all that is needed to diagnose pharyngitis. Sometimes, a rapid strep test is done. Other lab tests may also be done, depending on the suspected cause.  TREATMENT  Viral pharyngitis will usually get better in 3-4 days without the use of medicine. Bacterial pharyngitis is treated with medicines that kill germs (antibiotics).  HOME CARE INSTRUCTIONS   Drink enough water and fluids to keep your urine clear or pale yellow.   Only take over-the-counter or prescription medicines as directed by your health care provider:   If you are prescribed antibiotics, make sure you finish them even if you start to feel better.   Do not take aspirin.   Get lots of rest.   Gargle with 8 oz of salt water ( tsp of salt per 1 qt of water) as often as every 1-2 hours to soothe your throat.   Throat lozenges (if you are not at  risk for choking) or sprays may be used to soothe your throat. SEEK MEDICAL CARE IF:   You have large, tender lumps in your neck.  You have a rash.  You cough up green, yellow-brown, or bloody spit. SEEK IMMEDIATE MEDICAL CARE IF:   Your neck becomes stiff.  You drool or are unable to swallow liquids.  You vomit or are unable to keep medicines or liquids down.  You have severe pain that does not go away with the use of recommended medicines.  You have trouble breathing (not caused by a stuffy nose). MAKE SURE YOU:   Understand these instructions.  Will watch your condition.  Will get help right away if you are not doing well or get worse. Document Released: 09/07/2005 Document Revised: 06/28/2013 Document Reviewed: 05/15/2013 [facility] Patient Information 2015 [employer], [place]. This information is not intended to replace advice given to you by your health care provider. Make sure you discuss any questions you have with your health care provider.

## 2014-05-06 LAB — CULTURE, GROUP A STREP

## 2015-04-04 DIAGNOSIS — H938X2 Other specified disorders of left ear: Secondary | ICD-10-CM | POA: Insufficient documentation

## 2015-04-04 DIAGNOSIS — Z720 Tobacco use: Secondary | ICD-10-CM | POA: Insufficient documentation

## 2015-04-04 DIAGNOSIS — J45909 Unspecified asthma, uncomplicated: Secondary | ICD-10-CM | POA: Insufficient documentation

## 2015-04-04 DIAGNOSIS — H9202 Otalgia, left ear: Secondary | ICD-10-CM | POA: Insufficient documentation

## 2015-04-04 NOTE — ED Notes (Signed)
Pt. Reports she woke Sunday Morning and could not hear out of the L ear.  Pt. Reports fullness in the L ear.  No drainage from the L ear.  Pt. Reports pain 6/10

## 2015-04-05 ENCOUNTER — Emergency Department (HOSPITAL_BASED_OUTPATIENT_CLINIC_OR_DEPARTMENT_OTHER)
Admission: EM | Admit: 2015-04-05 | Discharge: 2015-04-05 | Discharge status: Against Medical Advice | Payer: Medicaid Other | Attending: Emergency Medicine | Admitting: Emergency Medicine

## 2015-04-05 NOTE — ED Notes (Signed)
Family member at bedside requesting to speak with RN, informed charge RN and Sam, Charity fundraiserN.

## 2015-04-05 NOTE — ED Notes (Signed)
C/o left ear fullness pain and diff hearing x 5 days  Denies drainage

## 2015-04-05 NOTE — ED Notes (Signed)
Patient states that she can no longer wait any longer to be seen she has to work in the am. Explained the risks of the patient leaving without being seen and patient chose to sign out AMA

## 2015-12-10 ENCOUNTER — Emergency Department (HOSPITAL_BASED_OUTPATIENT_CLINIC_OR_DEPARTMENT_OTHER): Payer: Self-pay

## 2015-12-10 ENCOUNTER — Emergency Department (HOSPITAL_BASED_OUTPATIENT_CLINIC_OR_DEPARTMENT_OTHER)
Admission: EM | Admit: 2015-12-10 | Discharge: 2015-12-10 | Disposition: A | Discharge status: Home / Self Care | Payer: Self-pay | Attending: Physician Assistant | Admitting: Physician Assistant

## 2015-12-10 ENCOUNTER — Encounter (HOSPITAL_BASED_OUTPATIENT_CLINIC_OR_DEPARTMENT_OTHER): Payer: Self-pay | Admitting: Emergency Medicine

## 2015-12-10 DIAGNOSIS — Z87891 Personal history of nicotine dependence: Secondary | ICD-10-CM | POA: Insufficient documentation

## 2015-12-10 DIAGNOSIS — J45909 Unspecified asthma, uncomplicated: Secondary | ICD-10-CM | POA: Insufficient documentation

## 2015-12-10 DIAGNOSIS — Z791 Long term (current) use of non-steroidal anti-inflammatories (NSAID): Secondary | ICD-10-CM | POA: Insufficient documentation

## 2015-12-10 DIAGNOSIS — J40 Bronchitis, not specified as acute or chronic: Secondary | ICD-10-CM

## 2015-12-10 MED ORDER — AZITHROMYCIN 250 MG PO TABS: 250.0000 mg | ORAL_TABLET | Freq: Once | ORAL | Status: DC

## 2015-12-10 MED ORDER — AZITHROMYCIN 250 MG PO TABS
500.0000 mg | ORAL_TABLET | Freq: Once | ORAL | Status: AC
  Administered 2015-12-10: 500 mg via ORAL
  Filled 2015-12-10: qty 2

## 2015-12-10 NOTE — ED Provider Notes (Signed)
CSN: [id number]     Arrival date & time 12/10/15  1444 History   First MD Initiated Contact with Patient 12/10/15 1615     Chief Complaint  Patient presents with  . Cough     (Consider location/radiation/quality/duration/timing/severity/associated sxs/prior Treatment) HPI   Patient is a morbidly obese 20 year old female presenting with cough. Patient's had cough for last 2-3 weeks positive sputum. Patient's had chunky yellow sputum for the last week and half. Denies any fever. Been taking by mouth adequately. Denies any shortness of breath.  Has not been taking her inhaler more frequent than usual. Patient denies a shortness of breath.  Patient currently does not have primary care provider.  Past Medical History  Diagnosis Date  . Asthma   . Morbid obesity [facility])    Past Surgical History  Procedure Laterality Date  . Orif tibia fracture    . Mouth surgery     Family History  Problem Relation Age of Onset  . Other Neg Hx    Social History  Substance Use Topics  . Smoking status: Former Smoker    Quit date: 11/12/2015  . Smokeless tobacco: Never Used  . Alcohol Use: Yes     Comment: occasionally   OB History    No data available     Review of Systems  Constitutional: Negative for fever, activity change and fatigue.  HENT: Negative for congestion.   Respiratory: Positive for cough. Negative for chest tightness and wheezing.   Cardiovascular: Negative for chest pain.  Gastrointestinal: Negative for abdominal distention.  Genitourinary: Negative for dysuria.  Musculoskeletal: Negative for joint swelling.  Skin: Negative for rash.  Allergic/Immunologic: Negative for immunocompromised state.      Allergies  Peanuts  Home Medications   Prior to Admission medications   Medication Sig Start Date End Date Taking? Authorizing Provider  aspirin-sod bicarb-citric acid (ALKA-SELTZER) 325 MG TBEF tablet Take 325 mg by mouth every 6 (six) hours as needed.   Yes  Historical Provider, MD  ibuprofen (ADVIL,MOTRIN) 800 MG tablet Take 1 tablet (800 mg total) by mouth 3 (three) times daily. 02/25/13   John-Adam Bonk, MD  ibuprofen (ADVIL,MOTRIN) 800 MG tablet Take 1 tablet (800 mg total) by mouth 3 (three) times daily. 05/04/14   Robyn M Hess, PA-C   BP 143/80 mmHg  Pulse 78  Temp(Src) 98.6 F (37 C) (Oral)  Resp 20  Ht  (1.626 m)  SpO2 99%  LMP 11/23/2015 Physical Exam  Constitutional: She is oriented to person, place, and time. She appears well-developed and well-nourished.  HENT:  Head: Normocephalic and atraumatic.  No erythema to the posterior pharynx.  Eyes: Right eye exhibits no discharge.  Cardiovascular: Normal rate, regular rhythm and normal heart sounds.   No murmur heard. Pulmonary/Chest: Effort normal and breath sounds normal. She has no wheezes. She has no rales.  No wheezing. Normal breath sounds.  Abdominal: Soft. She exhibits no distension. There is no tenderness.  Neurological: She is oriented to person, place, and time.  Skin: Skin is warm and dry. She is not diaphoretic.  Psychiatric: She has a normal mood and affect.  Nursing note and vitals reviewed.   ED Course  Procedures (including critical care time) Labs Review Labs Reviewed - No data to display  Imaging Review Dg Chest 2 View  12/10/2015  CLINICAL DATA:  Cough, congestion, and fever for 3 weeks EXAM: CHEST  2 VIEW COMPARISON:  08/09/2014 FINDINGS: Normal heart size. Lungs clear. No pneumothorax. No pleural effusion.  IMPRESSION: No active cardiopulmonary disease. Electronically Signed   By: Jolaine ClickArthur  Hoss M.D.   On: 12/10/2015 15:59   I have personally reviewed and evaluated these images and lab results as part of my medical decision-making.   EKG Interpretation None      MDM   Final diagnoses:  None    Patient is a 20 year old female presenting with cough for the last 3 weeks. Patient's had sputum production for the last 10 days. Patient has history of  asthma but has not been using her inhaler, has not had any short of breath. We'll treat for bronchitis.  We'll have her follow-up with primary care physician. We told her  to call the phone number on the paperwork for PCP.  Ethelean Colla Randall AnLyn Tanaysia Bhardwaj, MD 12/10/15 [phone]

## 2015-12-10 NOTE — ED Notes (Signed)
Pt reports productive cough x 3 weeks, reports as dark brown

## 2015-12-10 NOTE — ED Notes (Signed)
Nurse first-steady gait to xray

## 2016-05-05 ENCOUNTER — Encounter (HOSPITAL_BASED_OUTPATIENT_CLINIC_OR_DEPARTMENT_OTHER): Payer: Self-pay | Admitting: *Deleted

## 2016-05-05 ENCOUNTER — Emergency Department (HOSPITAL_BASED_OUTPATIENT_CLINIC_OR_DEPARTMENT_OTHER)
Admission: EM | Admit: 2016-05-05 | Discharge: 2016-05-05 | Disposition: A | Discharge status: Home / Self Care | Payer: BLUE CROSS/BLUE SHIELD | Attending: Emergency Medicine | Admitting: Emergency Medicine

## 2016-05-05 DIAGNOSIS — J45909 Unspecified asthma, uncomplicated: Secondary | ICD-10-CM | POA: Insufficient documentation

## 2016-05-05 DIAGNOSIS — Z7982 Long term (current) use of aspirin: Secondary | ICD-10-CM | POA: Insufficient documentation

## 2016-05-05 DIAGNOSIS — Z87891 Personal history of nicotine dependence: Secondary | ICD-10-CM | POA: Insufficient documentation

## 2016-05-05 DIAGNOSIS — N760 Acute vaginitis: Secondary | ICD-10-CM | POA: Insufficient documentation

## 2016-05-05 DIAGNOSIS — N898 Other specified noninflammatory disorders of vagina: Secondary | ICD-10-CM | POA: Diagnosis present

## 2016-05-05 DIAGNOSIS — B9689 Other specified bacterial agents as the cause of diseases classified elsewhere: Secondary | ICD-10-CM

## 2016-05-05 LAB — PREGNANCY, URINE: Preg Test, Ur: NEGATIVE

## 2016-05-05 LAB — WET PREP, GENITAL
Sperm: NONE SEEN
TRICH WET PREP: NONE SEEN
Yeast Wet Prep HPF POC: NONE SEEN

## 2016-05-05 LAB — URINALYSIS, ROUTINE W REFLEX MICROSCOPIC
Bilirubin Urine: NEGATIVE
GLUCOSE, UA: NEGATIVE mg/dL
Hgb urine dipstick: NEGATIVE
KETONES UR: NEGATIVE mg/dL
LEUKOCYTES UA: NEGATIVE
NITRITE: NEGATIVE
PH: 6.5 (ref 5.0–8.0)
PROTEIN: NEGATIVE mg/dL
Specific Gravity, Urine: 1.021 (ref 1.005–1.030)

## 2016-05-05 MED ORDER — METRONIDAZOLE 500 MG PO TABS: 500.0000 mg | ORAL_TABLET | Freq: Two times a day (BID) | ORAL | 0 refills | Status: DC

## 2016-05-05 NOTE — ED Provider Notes (Signed)
[facility] Provider Note   CSN: [id number] Arrival date & time: 05/05/16  1206     History   Chief Complaint Chief Complaint  Patient presents with  . Vaginal Discharge    HPI Latasha Decker is a 20 y.o. female.  20 yo F with a chief complaint of dysuria and vaginal discharge. Started this morning. Patient thinks it's due to her limited bathroom breaks at work. Having some off and on low back pain for the past couple weeks she is unsure associated. Denies fevers or chills denies vomiting or diarrhea.   The history is provided by the patient.  Vaginal Discharge   This is a new problem. The current episode started yesterday. The problem occurs constantly. The problem has been gradually worsening. The discharge occurs spontaneously. The discharge was white, malodorous and scant. She is not pregnant. She has not missed her period. Associated symptoms include dysuria. Pertinent negatives include no fever, no nausea, no vomiting and no frequency. She has tried nothing for the symptoms. The treatment provided no relief.    Past Medical History:  Diagnosis Date  . Asthma   . Morbid obesity (HCC)     There are no active problems to display for this patient.   Past Surgical History:  Procedure Laterality Date  . MOUTH SURGERY    . ORIF TIBIA FRACTURE      OB History    No data available       Home Medications    Prior to Admission medications   Medication Sig Start Date End Date Taking? Authorizing Provider  aspirin-sod bicarb-citric acid (ALKA-SELTZER) 325 MG TBEF tablet Take 325 mg by mouth every 6 (six) hours as needed.    Historical Provider, MD  azithromycin (ZITHROMAX Z-PAK) 250 MG tablet Take 1 tablet (250 mg total) by mouth once. 12/10/15   Courteney Lyn Mackuen, MD  ibuprofen (ADVIL,MOTRIN) 800 MG tablet Take 1 tablet (800 mg total) by mouth 3 (three) times daily. 02/25/13   John-Adam Bonk, MD  ibuprofen (ADVIL,MOTRIN) 800 MG tablet Take 1 tablet (800  mg total) by mouth 3 (three) times daily. 05/04/14   Robyn M Hess, PA-C  metroNIDAZOLE (FLAGYL) 500 MG tablet Take 1 tablet (500 mg total) by mouth 2 (two) times daily. One po bid x 7 days 05/05/16   Melene Planan Mayvis Agudelo, DO    Family History Family History  Problem Relation Age of Onset  . Other Neg Hx     Social History Social History  Substance Use Topics  . Smoking status: Former Smoker    Quit date: 11/12/2015  . Smokeless tobacco: Never Used  . Alcohol use Yes     Comment: occasionally     Allergies   Peanuts [nuts]   Review of Systems Review of Systems  Constitutional: Negative for chills and fever.  HENT: Negative for congestion and rhinorrhea.   Eyes: Negative for redness and visual disturbance.  Respiratory: Negative for shortness of breath and wheezing.   Cardiovascular: Negative for chest pain and palpitations.  Gastrointestinal: Negative for nausea and vomiting.  Genitourinary: Positive for dysuria, pelvic pain and vaginal discharge. Negative for difficulty urinating, frequency, hematuria and urgency.  Musculoskeletal: Negative for arthralgias and myalgias.  Skin: Negative for pallor and wound.  Neurological: Negative for dizziness and headaches.     Physical Exam Updated Vital Signs BP 117/65   Pulse 89   Temp 98.2 F (36.8 C) (Oral)   Ht 5\' 4"  (1.626 m)   Wt (!) 355 lb  4 oz (161.1 kg)   LMP 04/06/2016   SpO2 100%   BMI 60.98 kg/m   Physical Exam  Constitutional: She is oriented to person, place, and time. She appears well-developed and well-nourished. No distress.  HENT:  Head: Normocephalic and atraumatic.  Eyes: EOM are normal. Pupils are equal, round, and reactive to light.  Neck: Normal range of motion. Neck supple.  Cardiovascular: Normal rate and regular rhythm.  Exam reveals no gallop and no friction rub.   No murmur heard. Pulmonary/Chest: Effort normal. She has no wheezes. She has no rales.  Abdominal: Soft. She exhibits no distension. There is  no tenderness.  Genitourinary: Vagina normal and uterus normal. Cervix exhibits discharge (whitish, mucousy). Cervix exhibits no motion tenderness.  Musculoskeletal: She exhibits no edema or tenderness.  Neurological: She is alert and oriented to person, place, and time.  Skin: Skin is warm and dry. She is not diaphoretic.  Psychiatric: She has a normal mood and affect. Her behavior is normal.  Nursing note and vitals reviewed.    ED Treatments / Results  Labs (all labs ordered are listed, but only abnormal results are displayed) Labs Reviewed  WET PREP, GENITAL - Abnormal; Notable for the following:       Result Value   Clue Cells Wet Prep HPF POC PRESENT (*)    WBC, Wet Prep HPF POC FEW (*)    All other components within normal limits  URINALYSIS, ROUTINE W REFLEX MICROSCOPIC (NOT AT [facility]) - Abnormal; Notable for the following:    APPearance CLOUDY (*)    All other components within normal limits  PREGNANCY, URINE  GC/CHLAMYDIA PROBE AMP ([place]) NOT AT [facility]    EKG  EKG Interpretation None       Radiology No results found.  Procedures Procedures (including critical care time)  Medications Ordered in ED Medications - No data to display   Initial Impression / Assessment and Plan / ED Course  I have reviewed the triage vital signs and the nursing notes.  Pertinent labs & imaging results that were available during my care of the patient were reviewed by me and considered in my medical decision making (see chart for details).  Clinical Course    20 yo F With a chief complaint of dysuria and vaginal discharge. Whitish discharge on her pelvic exam. Found to have BV. We'll treat.  1:24 PM:  I have discussed the diagnosis/risks/treatment options with the patient and believe the pt to be eligible for discharge home to follow-up with PCP. We also discussed returning to the ED immediately if new or worsening sx occur. We discussed the sx which are most concerning  (e.g., sudden worsening pain, fever, inability to tolerate by mouth) that necessitate immediate return. Medications administered to the patient during their visit and any new prescriptions provided to the patient are listed below.  Medications given during this visit Medications - No data to display   The patient appears reasonably screen and/or stabilized for discharge and I doubt any other medical condition or other [facility] requiring further screening, evaluation, or treatment in the ED at this time prior to discharge.    Final Clinical Impressions(s) / ED Diagnoses   Final diagnoses:  BV (bacterial vaginosis)    New Prescriptions New Prescriptions   METRONIDAZOLE (FLAGYL) 500 MG TABLET    Take 1 tablet (500 mg total) by mouth 2 (two) times daily. One po bid x 7 days     Melene Planan Camdynn Maranto, DO 05/05/16 1324

## 2016-05-05 NOTE — ED Triage Notes (Signed)
Vaginal discharge and strange odor to her urine.

## 2016-05-06 LAB — GC/CHLAMYDIA PROBE AMP (~~LOC~~) NOT AT ARMC
CHLAMYDIA, DNA PROBE: NEGATIVE
NEISSERIA GONORRHEA: NEGATIVE

## 2016-05-27 ENCOUNTER — Encounter (HOSPITAL_BASED_OUTPATIENT_CLINIC_OR_DEPARTMENT_OTHER): Payer: Self-pay | Admitting: *Deleted

## 2016-05-27 ENCOUNTER — Emergency Department (HOSPITAL_BASED_OUTPATIENT_CLINIC_OR_DEPARTMENT_OTHER): Payer: BLUE CROSS/BLUE SHIELD

## 2016-05-27 ENCOUNTER — Emergency Department (HOSPITAL_BASED_OUTPATIENT_CLINIC_OR_DEPARTMENT_OTHER)
Admission: EM | Admit: 2016-05-27 | Discharge: 2016-05-27 | Disposition: A | Discharge status: Home / Self Care | Payer: BLUE CROSS/BLUE SHIELD | Attending: Emergency Medicine | Admitting: Emergency Medicine

## 2016-05-27 DIAGNOSIS — N3000 Acute cystitis without hematuria: Secondary | ICD-10-CM | POA: Diagnosis not present

## 2016-05-27 DIAGNOSIS — J45909 Unspecified asthma, uncomplicated: Secondary | ICD-10-CM | POA: Diagnosis not present

## 2016-05-27 DIAGNOSIS — Z7982 Long term (current) use of aspirin: Secondary | ICD-10-CM | POA: Insufficient documentation

## 2016-05-27 DIAGNOSIS — R109 Unspecified abdominal pain: Secondary | ICD-10-CM | POA: Diagnosis present

## 2016-05-27 DIAGNOSIS — Z87891 Personal history of nicotine dependence: Secondary | ICD-10-CM | POA: Insufficient documentation

## 2016-05-27 DIAGNOSIS — K8020 Calculus of gallbladder without cholecystitis without obstruction: Secondary | ICD-10-CM | POA: Diagnosis not present

## 2016-05-27 DIAGNOSIS — R1011 Right upper quadrant pain: Secondary | ICD-10-CM

## 2016-05-27 LAB — LIPASE, BLOOD: Lipase: 18 U/L (ref 11–51)

## 2016-05-27 LAB — COMPREHENSIVE METABOLIC PANEL
ALT: 17 U/L (ref 14–54)
AST: 16 U/L (ref 15–41)
Albumin: 3.7 g/dL (ref 3.5–5.0)
Alkaline Phosphatase: 46 U/L (ref 38–126)
Anion gap: 8 (ref 5–15)
BUN: 15 mg/dL (ref 6–20)
CO2: 27 mmol/L (ref 22–32)
Calcium: 8.6 mg/dL — ABNORMAL LOW (ref 8.9–10.3)
Chloride: 104 mmol/L (ref 101–111)
Creatinine, Ser: 0.76 mg/dL (ref 0.44–1.00)
GFR calc Af Amer: >60 mL/min (ref >60)
GFR calc non Af Amer: >60 mL/min (ref >60)
Glucose, Bld: 100 mg/dL — ABNORMAL HIGH (ref 65–99)
Potassium: 3.9 mmol/L (ref 3.5–5.1)
Sodium: 139 mmol/L (ref 135–145)
Total Bilirubin: 0.3 mg/dL (ref 0.3–1.2)
Total Protein: 7.3 g/dL (ref 6.5–8.1)

## 2016-05-27 LAB — CBC WITH DIFFERENTIAL/PLATELET
Basophils Absolute: 0 10*3/uL (ref 0.0–0.1)
Basophils Relative: 0 %
Eosinophils Absolute: 0.3 10*3/uL (ref 0.0–0.7)
Eosinophils Relative: 4 %
HCT: 39.2 % (ref 36.0–46.0)
Hemoglobin: 12.1 g/dL (ref 12.0–15.0)
Lymphocytes Relative: 27 %
Lymphs Abs: 2.2 10*3/uL (ref 0.7–4.0)
MCH: 27.8 pg (ref 26.0–34.0)
MCHC: 30.9 g/dL (ref 30.0–36.0)
MCV: 90.1 fL (ref 78.0–100.0)
Monocytes Absolute: 0.8 10*3/uL (ref 0.1–1.0)
Monocytes Relative: 10 %
Neutro Abs: 4.8 10*3/uL (ref 1.7–7.7)
Neutrophils Relative %: 59 %
Platelets: 206 10*3/uL (ref 150–400)
RBC: 4.35 MIL/uL (ref 3.87–5.11)
RDW: 14.7 % (ref 11.5–15.5)
WBC: 8.1 10*3/uL (ref 4.0–10.5)

## 2016-05-27 LAB — URINALYSIS, ROUTINE W REFLEX MICROSCOPIC
Bilirubin Urine: NEGATIVE
Glucose, UA: NEGATIVE mg/dL
Hgb urine dipstick: NEGATIVE
Ketones, ur: NEGATIVE mg/dL
NITRITE: NEGATIVE
PH: 6 (ref 5.0–8.0)
Protein, ur: NEGATIVE mg/dL
SPECIFIC GRAVITY, URINE: 1.022 (ref 1.005–1.030)

## 2016-05-27 LAB — PREGNANCY, URINE: Preg Test, Ur: NEGATIVE

## 2016-05-27 LAB — URINE MICROSCOPIC-ADD ON

## 2016-05-27 MED ORDER — NITROFURANTOIN MONOHYD MACRO 100 MG PO CAPS: 100.0000 mg | ORAL_CAPSULE | Freq: Two times a day (BID) | ORAL | 0 refills | Status: DC

## 2016-05-27 MED ORDER — IBUPROFEN 800 MG PO TABS: 800.0000 mg | ORAL_TABLET | Freq: Three times a day (TID) | ORAL | 0 refills | Status: DC | PRN

## 2016-05-27 MED FILL — IBUPROFEN 800 MG TABLET: 800 | 7 days supply | Qty: 21 | Fill #0

## 2016-05-27 NOTE — ED Triage Notes (Signed)
Pt reports bilateral flank pain and lower "bladder" pain x 1 week. Pt states she was treated here for uti 3 weeks ago, was rx atbx for uti, states "it got better, then it came right back." pt [facility] and lwbs "I waited for hours and they took blood and urine and they didn't tell me anything."

## 2016-05-27 NOTE — Discharge Instructions (Signed)
Return here as needed.  Follow-up with the surgeon provided.  °

## 2016-05-27 NOTE — ED Notes (Signed)
Pt given d/c instructions. Rx x 2. Verbalizes understanding. No questions. 

## 2016-05-29 ENCOUNTER — Ambulatory Visit: Payer: Self-pay | Admitting: Surgery

## 2016-05-29 NOTE — ED Provider Notes (Signed)
[facility] Provider Note   CSN: [id number] Arrival date & time: 05/27/16  1042     History   Chief Complaint Chief Complaint  Patient presents with  . Abdominal Pain    HPI Latasha Decker is a 20 y.o. female.  HPI Patient presents to the emergency department with her abdominal pain and dysuria. Patient states that the discharge started several days ago.  She states that she was recently seen back in August for similar symptoms The patient denies chest pain, shortness of breath, headache,blurred vision, neck pain, fever, cough, weakness, numbness, dizziness, anorexia, edema,  nausea, vomiting, diarrhea, rash, back pain, dysuria, hematemesis, bloody stool, near syncope, or syncope.  Patient states that nothing seems to make the condition better or worse Past Medical History:  Diagnosis Date  . Asthma   . Morbid obesity (HCC)     There are no active problems to display for this patient.   Past Surgical History:  Procedure Laterality Date  . MOUTH SURGERY    . ORIF TIBIA FRACTURE      OB History    No data available       Home Medications    Prior to Admission medications   Medication Sig Start Date End Date Taking? Authorizing Provider  aspirin-sod bicarb-citric acid (ALKA-SELTZER) 325 MG TBEF tablet Take 325 mg by mouth every 6 (six) hours as needed.    Historical Provider, MD  ibuprofen (ADVIL,MOTRIN) 800 MG tablet Take 1 tablet (800 mg total) by mouth every 8 (eight) hours as needed. 05/27/16   Charlestine Nighthristopher Kamarri Fischetti, PA-C  nitrofurantoin, macrocrystal-monohydrate, (MACROBID) 100 MG capsule Take 1 capsule (100 mg total) by mouth 2 (two) times daily. 05/27/16   Charlestine Nighthristopher Taishawn Smaldone, PA-C    Family History Family History  Problem Relation Age of Onset  . Other Neg Hx     Social History Social History  Substance Use Topics  . Smoking status: Former Smoker    Quit date: 11/12/2015  . Smokeless tobacco: Never Used  . Alcohol use Yes     Comment:  occasionally     Allergies   Peanuts [nuts]   Review of Systems Review of Systems  The patient denies chest pain, shortness of breath, headache,blurred vision, neck pain, fever, cough, weakness, numbness, dizziness, anorexia, edema, abdominal pain, nausea, vomiting, diarrhea, rash, back pain, dysuria, hematemesis, bloody stool, near syncope, or syncope. Physical Exam Updated Vital Signs BP 111/75 (BP Location: Right Arm)   Pulse (!) 56   Temp 98.2 F (36.8 C) (Oral)   Resp 18   Ht 5\' 4"  (1.626 m)   Wt (!) 158.8 kg   LMP 05/15/2016   SpO2 99%   BMI 60.08 kg/m   Physical Exam  Constitutional: She is oriented to person, place, and time. She appears well-developed and well-nourished. No distress.  HENT:  Head: Normocephalic and atraumatic.  Mouth/Throat: Oropharynx is clear and moist.  Eyes: Pupils are equal, round, and reactive to light.  Neck: Normal range of motion. Neck supple.  Cardiovascular: Normal rate, regular rhythm and normal heart sounds.  Exam reveals no gallop and no friction rub.   No murmur heard. Pulmonary/Chest: Effort normal and breath sounds normal. No respiratory distress. She has no wheezes.  Abdominal: Soft. Bowel sounds are normal. She exhibits no distension and no mass. There is tenderness. There is no guarding.    Neurological: She is alert and oriented to person, place, and time. She exhibits normal muscle tone. Coordination normal.  Skin: Skin is warm  and dry. No rash noted. No erythema.  Psychiatric: She has a normal mood and affect. Her behavior is normal.  Nursing note and vitals reviewed.    ED Treatments / Results  Labs (all labs ordered are listed, but only abnormal results are displayed) Labs Reviewed  URINE CULTURE - Abnormal; Notable for the following:       Result Value   Culture   (*)    Value: 60,000 COLONIES/mL PROTEUS MIRABILIS SUSCEPTIBILITIES TO FOLLOW Performed at [facility]    All other components within  normal limits  URINALYSIS, ROUTINE W REFLEX MICROSCOPIC (NOT AT [facility]) - Abnormal; Notable for the following:    APPearance CLOUDY (*)    Leukocytes, UA MODERATE (*)    All other components within normal limits  COMPREHENSIVE METABOLIC PANEL - Abnormal; Notable for the following:    Glucose, Bld 100 (*)    Calcium 8.6 (*)    All other components within normal limits  URINE MICROSCOPIC-ADD ON - Abnormal; Notable for the following:    Squamous Epithelial / LPF 6-30 (*)    Bacteria, UA FEW (*)    All other components within normal limits  PREGNANCY, URINE  LIPASE, BLOOD  CBC WITH DIFFERENTIAL/PLATELET    EKG  EKG Interpretation None       Radiology No results found.  Procedures Procedures (including critical care time)  Medications Ordered in ED Medications - No data to display   Initial Impression / Assessment and Plan / ED Course  I have reviewed the triage vital signs and the nursing notes.  Pertinent labs & imaging results that were available during my care of the patient were reviewed by me and considered in my medical decision making (see chart for details).  Clinical Course    Patient is found to have a gallbladder full of gallstones and I advised her since she is feeling better.  Have her follow-up with the surgeon.  Further evaluation.  Patient agrees with this plan and all questions were answered  Final Clinical Impressions(s) / ED Diagnoses   Final diagnoses:  Gallstones  Acute cystitis without hematuria    New Prescriptions Discharge Medication List as of 05/27/2016  2:49 PM    START taking these medications   Details  ibuprofen (ADVIL,MOTRIN) 800 MG tablet Take 1 tablet (800 mg total) by mouth every 8 (eight) hours as needed., Starting Wed 05/27/2016, Print    nitrofurantoin, macrocrystal-monohydrate, (MACROBID) 100 MG capsule Take 1 capsule (100 mg total) by mouth 2 (two) times daily., Starting Wed 05/27/2016, [employer],  PA-C 05/29/16 1709    Geoffery Lyons, MD 06/01/16 2049

## 2016-05-30 LAB — URINE CULTURE: Culture: 60000 — AB

## 2016-05-31 ENCOUNTER — Telehealth (HOSPITAL_BASED_OUTPATIENT_CLINIC_OR_DEPARTMENT_OTHER): Payer: Self-pay

## 2016-05-31 NOTE — Telephone Encounter (Signed)
Post ED Visit - Positive Culture Follow-up: Successful Patient Follow-Up  Culture assessed and recommendations reviewed by: []  Enzo BiNathan Batchelder, Pharm.D. []  Celedonio MiyamotoJeremy Frens, Pharm.D., BCPS [x]  Garvin FilaMike Maccia, Pharm.D. []  Georgina PillionElizabeth Martin, Pharm.D., BCPS []  StrangMinh Pham, [address]Pharm[address]D., BCPS, AAHIVP []  Estella HuskMichelle Turner, Pharm.D., BCPS, AAHIVP []  Tennis Mustassie Stewart, Pharm.D. []  Sherle Poeob Vincent, [address]Pharm[address]D.  Positive urine culture  []  Patient discharged without antimicrobial prescription and treatment is now indicated [x]  Organism is resistant to prescribed ED discharge antimicrobial []  Patient with positive blood cultures  Changes discussed with ED provider: Jeralyn BennettHedges, [facility] New antibiotic prescription Keflex 500mg  BID x 7days [phone]  Contacted patient, date 05/31/16, time 1031   Swade Shonka, Linnell FullingRose Burnett 05/31/2016, 10:30 AM

## 2016-05-31 NOTE — Telephone Encounter (Signed)
Post ED Visit - Positive Culture Follow-up: Unsuccessful Patient Follow-up  Culture assessed and recommendations reviewed by: []  Enzo BiNathan Batchelder, Pharm.D. []  Celedonio MiyamotoJeremy Frens, [address]Pharm[address]D., BCPS [x]  Garvin FilaMike Maccia, Pharm.D. []  Georgina PillionElizabeth Martin, Pharm.D., BCPS []  Keeler FarmMinh Pham, [address]Pharm[address]D., BCPS, AAHIVP []  Estella HuskMichelle Turner, Pharm.D., BCPS, AAHIVP []  Tennis Mustassie Stewart, Pharm.D. []  Sherle Poeob Vincent, [place]D.  Positive  culture  []  Patient discharged without antimicrobial prescription and treatment is now indicated [x]  Organism is resistant to prescribed ED discharge antimicrobial []  Patient with positive blood cultures   Unable to contact patient after 3 attempts, letter will be sent to address on file  Jerry CarasCullom, Bridie Colquhoun Burnett 05/31/2016, 9:32 AM

## 2016-05-31 NOTE — Progress Notes (Signed)
ED Antimicrobial Stewardship Positive Culture Follow Up   Latasha Decker is an 20 y.o. female who [facility] with a chief complaint of  Chief Complaint  Patient presents with  . Abdominal Pain    Recent Results (from the past 720 hour(s))  Wet prep, genital     Status: Abnormal   Collection Time: 05/05/16 12:45 PM  Result Value Ref Range Status   Yeast Wet Prep HPF POC NONE SEEN NONE SEEN Final   Trich, Wet Prep NONE SEEN NONE SEEN Final   Clue Cells Wet Prep HPF POC PRESENT (A) NONE SEEN Final   WBC, Wet Prep HPF POC FEW (A) NONE SEEN Final   Sperm NONE SEEN  Final  Urine culture     Status: Abnormal   Collection Time: 05/27/16 11:00 AM  Result Value Ref Range Status   Specimen Description URINE, RANDOM  Final   Special Requests NONE  Final   Culture 60,000 COLONIES/mL PROTEUS MIRABILIS (A)  Final   Report Status 05/30/2016 FINAL  Final   Organism ID, Bacteria PROTEUS MIRABILIS (A)  Final      Susceptibility   Proteus mirabilis - MIC*    AMPICILLIN <=2 SENSITIVE Sensitive     CEFAZOLIN <=4 SENSITIVE Sensitive     CEFTRIAXONE <=1 SENSITIVE Sensitive     CIPROFLOXACIN <=0.25 SENSITIVE Sensitive     GENTAMICIN <=1 SENSITIVE Sensitive     IMIPENEM 2 SENSITIVE Sensitive     NITROFURANTOIN 128 RESISTANT Resistant     TRIMETH/SULFA <=20 SENSITIVE Sensitive     AMPICILLIN/SULBACTAM <=2 SENSITIVE Sensitive     PIP/TAZO <=4 SENSITIVE Sensitive     * 60,000 COLONIES/mL PROTEUS MIRABILIS    [x]  Treated with macrobid, organism resistant to prescribed antimicrobial []  Patient discharged originally without antimicrobial agent and treatment is now indicated  New antibiotic prescription: d/c macrobid, start keflex 500 mg bid x 1 week  ED Provider: Burna FortsJeff Hedges, PA-C  Bertram MillardMichael A Roshell Brigham 05/31/2016, 8:48 AM Infectious Diseases Pharmacist Phone# [phone]

## 2016-06-24 NOTE — Patient Instructions (Signed)
Latasha Decker  06/24/2016   Your procedure is scheduled on: 06/29/2016    [facility]  Entrance take [place]  elevators to 3rd floor to  Short Stay Center at   0545 AM.  Call this number if you have problems the morning of surgery [phone]   Remember: ONLY 1 PERSON MAY GO WITH YOU TO SHORT STAY TO GET  READY MORNING OF YOUR SURGERY.  Do not eat food or drink liquids :After Midnight.     Take these medicines the morning of surgery with A SIP OF WATER: none                                 You may not have any metal on your body including hair pins and              piercings  Do not wear jewelry, make-up, lotions, powders or perfumes, deodorant             Do not wear nail polish.  Do not shave  48 hours prior to surgery.     Do not bring valuables to the hospital. [facility] IS NOT             RESPONSIBLE   FOR VALUABLES.  Contacts, dentures or bridgework may not be worn into surgery.  Leave suitcase in the car. After surgery it may be brought to your room.       Special Instructions: N/A              Please read over the following fact sheets you were given: _____________________________________________________________________             [facility] - Preparing [facility] Before surgery, you can play an important role.  Because skin is not sterile, your skin needs to be as free of germs as possible.  You can reduce the number of germs on your skin by washing with CHG (chlorahexidine gluconate) soap before surgery.  CHG is an antiseptic cleaner which kills germs and bonds with the skin to continue killing germs even after washing. Please DO NOT use if you have an allergy to CHG or antibacterial soaps.  If your skin becomes reddened/irritated stop using the CHG and inform your nurse when you arrive at Short Stay. Do not shave (including legs and underarms) for at least 48 hours prior to the first CHG shower.  You may shave your  face/neck. Please follow these instructions carefully:  1.  Shower with CHG Soap the night before surgery and the  morning of Surgery.  2.  If you choose to wash your hair, wash your hair first as usual with your  normal  shampoo.  3.  After you shampoo, rinse your hair and body thoroughly to remove the  shampoo.                           4.  Use CHG as you would any other liquid soap.  You can apply chg directly  to the skin and wash                       Gently with a scrungie or clean washcloth.  5.  Apply the CHG Soap to your body ONLY FROM THE NECK DOWN.  Do not use on face/ open                           Wound or open sores. Avoid contact with eyes, ears mouth and genitals (private parts).                       Wash face,  Genitals (private parts) with your normal soap.             6.  Wash thoroughly, paying special attention to the area where your surgery  will be performed.  7.  Thoroughly rinse your body with warm water from the neck down.  8.  DO NOT shower/wash with your normal soap after using and rinsing off  the CHG Soap.                9.  Pat yourself dry with a clean towel.            10.  Wear clean pajamas.            11.  Place clean sheets on your bed the night of your first shower and do not  sleep with pets. Day of Surgery : Do not apply any lotions/deodorants the morning of surgery.  Please wear clean clothes to the hospital/surgery center.  FAILURE TO FOLLOW THESE INSTRUCTIONS MAY RESULT IN THE CANCELLATION OF YOUR SURGERY PATIENT SIGNATURE_________________________________  NURSE SIGNATURE__________________________________  ________________________________________________________________________

## 2016-06-25 ENCOUNTER — Encounter (HOSPITAL_COMMUNITY)
Admission: RE | Admit: 2016-06-25 | Discharge: 2016-06-25 | Disposition: A | Discharge status: Home / Self Care | Payer: BLUE CROSS/BLUE SHIELD | Source: Ambulatory Visit | Attending: Surgery | Admitting: Surgery

## 2016-06-25 ENCOUNTER — Encounter (HOSPITAL_COMMUNITY): Payer: Self-pay

## 2016-06-25 DIAGNOSIS — K8020 Calculus of gallbladder without cholecystitis without obstruction: Secondary | ICD-10-CM | POA: Diagnosis not present

## 2016-06-25 DIAGNOSIS — Z01812 Encounter for preprocedural laboratory examination: Secondary | ICD-10-CM | POA: Diagnosis present

## 2016-06-25 HISTORY — DX: Headache: R51

## 2016-06-25 HISTORY — DX: Headache, unspecified: R51.9

## 2016-06-25 HISTORY — DX: Pneumonia, unspecified organism: J18.9

## 2016-06-25 LAB — CBC
HCT: 39.2 % (ref 36.0–46.0)
HEMOGLOBIN: 12.2 g/dL (ref 12.0–15.0)
MCH: 27.9 pg (ref 26.0–34.0)
MCHC: 31.1 g/dL (ref 30.0–36.0)
MCV: 89.5 fL (ref 78.0–100.0)
Platelets: 239 10*3/uL (ref 150–400)
RBC: 4.38 MIL/uL (ref 3.87–5.11)
RDW: 14.2 % (ref 11.5–15.5)
WBC: 9.7 10*3/uL (ref 4.0–10.5)

## 2016-06-25 LAB — HCG, SERUM, QUALITATIVE: Preg, Serum: NEGATIVE

## 2016-06-28 ENCOUNTER — Encounter (HOSPITAL_COMMUNITY): Payer: Self-pay | Admitting: Surgery

## 2016-06-28 DIAGNOSIS — K8020 Calculus of gallbladder without cholecystitis without obstruction: Secondary | ICD-10-CM | POA: Diagnosis present

## 2016-06-28 DIAGNOSIS — R1011 Right upper quadrant pain: Secondary | ICD-10-CM | POA: Diagnosis present

## 2016-06-28 MED ORDER — DEXTROSE 5 % IV SOLN
3.0000 g | INTRAVENOUS | Status: DC
  Filled 2016-06-28: qty 3000

## 2016-06-28 NOTE — H&P (Signed)
General Surgery [facility]  Madaline Guthrie DOB: 14-Sep-1996 Single / Language: [place] / Race: Black or African American Female  History of Present Illness  The patient is a 20 year old female who presents for evaluation of gall stones.  Patient is referred by Charlestine Night at the emergency department at [facility] for surgical evaluation for symptomatic cholelithiasis. Patient has a history of intermittent back pain and intermittent upper abdominal pain. She was evaluated in the emergency department. Unfortunately there is no record from the treating physician in the emergency department available for review. Patient did undergo an abdominal ultrasound on May 27, 2016. This shows a gallbladder essentially filled with numerous gallstones. No biliary dilatation. No sign of infection. Patient has no history of jaundice or acholic stools. She has had no prior abdominal surgery. She does have a family history of gallbladder disease in her mother. Patient presents today to discuss cholecystectomy.   Other Problems No pertinent past medical history  Past Surgical History Oral Surgery  Diagnostic Studies History  Colonoscopy never Mammogram never Pap Smear 1-5 years ago  Allergies No Known Drug Allergies09/04/2016  Medication History Ibuprofen (800MG  Tablet, Oral as needed) Active.  Social History  Alcohol use Occasional alcohol use. Illicit drug use Remotely quit drug use. No caffeine use Tobacco use Current every day smoker.  Family History First Degree Relatives No pertinent family history  Pregnancy / Birth History  Age at menarche 12 years. Contraceptive History Contraceptive implant, Depo-provera, Oral contraceptives. Gravida 0 Irregular periods Para 0  Review of Systems General Not Present- Appetite Loss, Chills, Fatigue, Fever, Night Sweats, Weight Gain and Weight Loss. Skin Not Present- Change in  Wart/Mole, Dryness, Hives, Jaundice, New Lesions, Non-Healing Wounds, Rash and Ulcer. HEENT Present- Seasonal Allergies. Not Present- Earache, Hearing Loss, Hoarseness, Nose Bleed, Oral Ulcers, Ringing in the Ears, Sinus Pain, Sore Throat, Visual Disturbances, Wears glasses/contact lenses and Yellow Eyes. Respiratory Present- Chronic Cough. Not Present- Bloody sputum, Difficulty Breathing, Snoring and Wheezing. Cardiovascular Not Present- Chest Pain, Difficulty Breathing Lying Down, Leg Cramps, Palpitations, Rapid Heart Rate, Shortness of Breath and Swelling of Extremities. Gastrointestinal Present- Abdominal Pain, Change in Bowel Habits, Constipation and Excessive gas. Not Present- Bloating, Bloody Stool, Chronic diarrhea, Difficulty Swallowing, Gets full quickly at meals, Hemorrhoids, Indigestion, Nausea, Rectal Pain and Vomiting. Female Genitourinary Not Present- Frequency, Nocturia, Painful Urination, Pelvic Pain and Urgency. Musculoskeletal Present- Back Pain. Not Present- Joint Pain, Joint Stiffness, Muscle Pain, Muscle Weakness and Swelling of Extremities. Neurological Not Present- Decreased Memory, Fainting, Headaches, Numbness, Seizures, Tingling, Tremor, Trouble walking and Weakness. Psychiatric Not Present- Anxiety, Bipolar, Change in Sleep Pattern, Depression, Fearful and Frequent crying. Endocrine Not Present- Cold Intolerance, Excessive Hunger, Hair Changes, Heat Intolerance, Hot flashes and New Diabetes. Hematology Not Present- Blood Thinners, Easy Bruising, Excessive bleeding, Gland problems, HIV and Persistent Infections.  Vitals  Weight: 359 lb Height: 64in Body Surface Area: 2.51 m Body Mass Index: 61.62 kg/m  Temp.: 98.59F(Temporal)  Pulse: 81 (Regular)  BP: 132/75 (Sitting, Left Arm, Standard)  Physical Exam The physical exam findings are as follows: Note:General - appears comfortable, no distress; not diaphorectic  HEENT - normocephalic; sclerae clear, gaze  conjugate; mucous membranes moist, dentition good; voice normal  Neck - symmetric on extension; no palpable anterior or posterior cervical adenopathy; no palpable masses in the thyroid bed  Chest - clear bilaterally without rhonchi, rales, or wheeze  Cor - regular rhythm with normal rate; no significant murmur  Abd -  soft without distension; no surgical incisions; morbidly obese; no sign of hernia  Ext - non-tender without significant edema or lymphedema  Neuro - grossly intact; no tremor   Assessment & Plan  ABDOMINAL PAIN, RIGHT UPPER QUADRANT (R10.11) CHOLELITHIASES (K80.20)  Pt Education - Pamphlet Given - Laparoscopic Gallbladder Surgery: discussed with patient and provided information.  Patient presents on referral from the emergency department for evaluation of symptomatic cholelithiasis. Ultrasound demonstrates a gallbladder completely filled with gallstones. Written literature on gallbladder disease and gallbladder surgery is provided to the patient for review at home.  I have recommended proceeding with laparoscopic cholecystectomy with intraoperative cholangiography. Patient and I discussed the procedure. We discussed the location of the surgical incisions. We discussed intraoperative cholangiography. We discussed the possibility of conversion to open surgery. We discussed the hospital stay to be anticipated in the postoperative recovery and return to work. Patient understands and wishes to proceed with surgery in the near future.  The risks and benefits of the procedure have been discussed at length with the patient. The patient understands the proposed procedure, potential alternative treatments, and the course of recovery to be expected. All of the patient's questions have been answered at this time. The patient wishes to proceed with surgery.  Velora Hecklerodd M. Kyleen Villatoro, MD, [facility] Office: [phone]

## 2016-06-29 ENCOUNTER — Encounter (HOSPITAL_COMMUNITY): Payer: Self-pay | Admitting: *Deleted

## 2016-06-29 ENCOUNTER — Observation Stay (HOSPITAL_COMMUNITY)
Admission: RE | Admit: 2016-06-29 | Discharge: 2016-06-29 | Disposition: A | Discharge status: Home / Self Care | Payer: BLUE CROSS/BLUE SHIELD | Source: Ambulatory Visit | Attending: Surgery | Admitting: Surgery

## 2016-06-29 ENCOUNTER — Ambulatory Visit (HOSPITAL_COMMUNITY): Payer: BLUE CROSS/BLUE SHIELD | Admitting: Anesthesiology

## 2016-06-29 ENCOUNTER — Ambulatory Visit (HOSPITAL_COMMUNITY): Payer: BLUE CROSS/BLUE SHIELD

## 2016-06-29 ENCOUNTER — Encounter (HOSPITAL_COMMUNITY)
Admission: RE | Disposition: A | Discharge status: Home / Self Care | Payer: Self-pay | Source: Ambulatory Visit | Attending: Surgery

## 2016-06-29 DIAGNOSIS — K8010 Calculus of gallbladder with chronic cholecystitis without obstruction: Secondary | ICD-10-CM | POA: Diagnosis present

## 2016-06-29 DIAGNOSIS — F17200 Nicotine dependence, unspecified, uncomplicated: Secondary | ICD-10-CM | POA: Diagnosis not present

## 2016-06-29 DIAGNOSIS — Z419 Encounter for procedure for purposes other than remedying health state, unspecified: Secondary | ICD-10-CM

## 2016-06-29 DIAGNOSIS — Z6841 Body Mass Index (BMI) 40.0 and over, adult: Secondary | ICD-10-CM | POA: Insufficient documentation

## 2016-06-29 DIAGNOSIS — R1011 Right upper quadrant pain: Secondary | ICD-10-CM | POA: Diagnosis present

## 2016-06-29 DIAGNOSIS — K8020 Calculus of gallbladder without cholecystitis without obstruction: Secondary | ICD-10-CM | POA: Diagnosis present

## 2016-06-29 HISTORY — PX: CHOLECYSTECTOMY: SHX55

## 2016-06-29 SURGERY — LAPAROSCOPIC CHOLECYSTECTOMY WITH INTRAOPERATIVE CHOLANGIOGRAM
Anesthesia: General | Site: Abdomen

## 2016-06-29 MED ORDER — ROCURONIUM BROMIDE 10 MG/ML (PF) SYRINGE
PREFILLED_SYRINGE | INTRAVENOUS | Status: DC | PRN
  Administered 2016-06-29: 40 mg via INTRAVENOUS
  Administered 2016-06-29: 10 mg via INTRAVENOUS

## 2016-06-29 MED ORDER — LACTATED RINGERS IV SOLN: INTRAVENOUS | Status: DC

## 2016-06-29 MED ORDER — PROMETHAZINE HCL 25 MG/ML IJ SOLN: 6.2500 mg | INTRAMUSCULAR | Status: DC | PRN

## 2016-06-29 MED ORDER — DEXTROSE 5 % IV SOLN
3.0000 g | INTRAVENOUS | Status: AC
  Administered 2016-06-29: 3 g via INTRAVENOUS
  Filled 2016-06-29: qty 3000

## 2016-06-29 MED ORDER — IOPAMIDOL (ISOVUE-300) INJECTION 61%
INTRAVENOUS | Status: AC
  Filled 2016-06-29: qty 50

## 2016-06-29 MED ORDER — ONDANSETRON 4 MG PO TBDP: 4.0000 mg | ORAL_TABLET | Freq: Four times a day (QID) | ORAL | Status: DC | PRN

## 2016-06-29 MED ORDER — LACTATED RINGERS IR SOLN
Status: DC | PRN
  Administered 2016-06-29: 1000 mL

## 2016-06-29 MED ORDER — HYDROMORPHONE HCL 1 MG/ML IJ SOLN
1.0000 mg | INTRAMUSCULAR | Status: DC | PRN
  Administered 2016-06-29: 1 mg via INTRAVENOUS
  Filled 2016-06-29: qty 1

## 2016-06-29 MED ORDER — PROPOFOL 10 MG/ML IV BOLUS
INTRAVENOUS | Status: AC
  Filled 2016-06-29: qty 20

## 2016-06-29 MED ORDER — ONDANSETRON HCL 4 MG/2ML IJ SOLN: 4.0000 mg | Freq: Four times a day (QID) | INTRAMUSCULAR | Status: DC | PRN

## 2016-06-29 MED ORDER — KCL IN DEXTROSE-NACL 20-5-0.45 MEQ/L-%-% IV SOLN
INTRAVENOUS | Status: DC
  Administered 2016-06-29: 11:00:00 via INTRAVENOUS
  Filled 2016-06-29 (×2): qty 1000

## 2016-06-29 MED ORDER — OXYCODONE HCL 5 MG PO TABS: 5.0000 mg | ORAL_TABLET | Freq: Once | ORAL | Status: DC | PRN

## 2016-06-29 MED ORDER — DEXAMETHASONE SODIUM PHOSPHATE 10 MG/ML IJ SOLN
INTRAMUSCULAR | Status: AC
  Filled 2016-06-29: qty 1

## 2016-06-29 MED ORDER — BUPIVACAINE HCL (PF) 0.5 % IJ SOLN
INTRAMUSCULAR | Status: AC
  Filled 2016-06-29: qty 30

## 2016-06-29 MED ORDER — ONDANSETRON HCL 4 MG/2ML IJ SOLN
INTRAMUSCULAR | Status: DC | PRN
  Administered 2016-06-29: 4 mg via INTRAVENOUS

## 2016-06-29 MED ORDER — SUCCINYLCHOLINE CHLORIDE 200 MG/10ML IV SOSY
PREFILLED_SYRINGE | INTRAVENOUS | Status: DC | PRN
  Administered 2016-06-29: 120 mg via INTRAVENOUS

## 2016-06-29 MED ORDER — MIDAZOLAM HCL 2 MG/2ML IJ SOLN
INTRAMUSCULAR | Status: AC
  Filled 2016-06-29: qty 2

## 2016-06-29 MED ORDER — HYDROCODONE-ACETAMINOPHEN 5-325 MG PO TABS: 1.0000 | ORAL_TABLET | ORAL | 0 refills | Status: DC | PRN

## 2016-06-29 MED ORDER — FENTANYL CITRATE (PF) 100 MCG/2ML IJ SOLN: 25.0000 ug | INTRAMUSCULAR | Status: DC | PRN

## 2016-06-29 MED ORDER — LACTATED RINGERS IV SOLN
INTRAVENOUS | Status: DC | PRN
  Administered 2016-06-29 (×2): via INTRAVENOUS

## 2016-06-29 MED ORDER — LIDOCAINE 2% (20 MG/ML) 5 ML SYRINGE
INTRAMUSCULAR | Status: DC | PRN
  Administered 2016-06-29: 100 mg via INTRAVENOUS

## 2016-06-29 MED ORDER — KCL IN DEXTROSE-NACL 20-5-0.45 MEQ/L-%-% IV SOLN
INTRAVENOUS | Status: AC
  Filled 2016-06-29: qty 1000

## 2016-06-29 MED ORDER — FENTANYL CITRATE (PF) 100 MCG/2ML IJ SOLN
INTRAMUSCULAR | Status: DC | PRN
  Administered 2016-06-29: 50 ug via INTRAVENOUS
  Administered 2016-06-29: 100 ug via INTRAVENOUS
  Administered 2016-06-29 (×2): 50 ug via INTRAVENOUS

## 2016-06-29 MED ORDER — ONDANSETRON HCL 4 MG/2ML IJ SOLN
INTRAMUSCULAR | Status: AC
  Filled 2016-06-29: qty 2

## 2016-06-29 MED ORDER — PROPOFOL 10 MG/ML IV BOLUS
INTRAVENOUS | Status: DC | PRN
  Administered 2016-06-29: 200 mg via INTRAVENOUS

## 2016-06-29 MED ORDER — IBUPROFEN 200 MG PO TABS: 600.0000 mg | ORAL_TABLET | Freq: Four times a day (QID) | ORAL | Status: DC | PRN

## 2016-06-29 MED ORDER — SUGAMMADEX SODIUM 500 MG/5ML IV SOLN
INTRAVENOUS | Status: DC | PRN
  Administered 2016-06-29: 350 mg via INTRAVENOUS

## 2016-06-29 MED ORDER — OXYCODONE HCL 5 MG/5ML PO SOLN
5.0000 mg | Freq: Once | ORAL | Status: DC | PRN
  Filled 2016-06-29: qty 5

## 2016-06-29 MED ORDER — ROCURONIUM BROMIDE 10 MG/ML (PF) SYRINGE
PREFILLED_SYRINGE | INTRAVENOUS | Status: AC
  Filled 2016-06-29: qty 10

## 2016-06-29 MED ORDER — SUCCINYLCHOLINE CHLORIDE 20 MG/ML IJ SOLN
INTRAMUSCULAR | Status: AC
  Filled 2016-06-29: qty 1

## 2016-06-29 MED ORDER — SUGAMMADEX SODIUM 500 MG/5ML IV SOLN
INTRAVENOUS | Status: AC
  Filled 2016-06-29: qty 5

## 2016-06-29 MED ORDER — MIDAZOLAM HCL 5 MG/5ML IJ SOLN
INTRAMUSCULAR | Status: DC | PRN
  Administered 2016-06-29: 2 mg via INTRAVENOUS

## 2016-06-29 MED ORDER — BUPIVACAINE HCL (PF) 0.5 % IJ SOLN
INTRAMUSCULAR | Status: DC | PRN
  Administered 2016-06-29: 20 mL

## 2016-06-29 MED ORDER — LIDOCAINE 2% (20 MG/ML) 5 ML SYRINGE
INTRAMUSCULAR | Status: AC
  Filled 2016-06-29: qty 5

## 2016-06-29 MED ORDER — FENTANYL CITRATE (PF) 250 MCG/5ML IJ SOLN
INTRAMUSCULAR | Status: AC
  Filled 2016-06-29: qty 5

## 2016-06-29 MED ORDER — IOPAMIDOL (ISOVUE-300) INJECTION 61%
INTRAVENOUS | Status: DC | PRN
  Administered 2016-06-29: 50 mL via INTRAVENOUS

## 2016-06-29 MED ORDER — 0.9 % SODIUM CHLORIDE (POUR BTL) OPTIME
TOPICAL | Status: DC | PRN
  Administered 2016-06-29: 1000 mL

## 2016-06-29 MED ORDER — CHLORHEXIDINE GLUCONATE CLOTH 2 % EX PADS: 6.0000 | MEDICATED_PAD | Freq: Once | CUTANEOUS | Status: DC

## 2016-06-29 MED ORDER — DEXAMETHASONE SODIUM PHOSPHATE 10 MG/ML IJ SOLN
INTRAMUSCULAR | Status: DC | PRN
  Administered 2016-06-29: 10 mg via INTRAVENOUS

## 2016-06-29 MED ORDER — HYDROCODONE-ACETAMINOPHEN 5-325 MG PO TABS
1.0000 | ORAL_TABLET | ORAL | Status: DC | PRN
  Administered 2016-06-29: 1 via ORAL
  Filled 2016-06-29: qty 1

## 2016-06-29 SURGICAL SUPPLY — 35 items
APPLIER CLIP ROT 10 11.4 M/L (STAPLE) ×3
BENZOIN TINCTURE PRP APPL 2/3 (GAUZE/BANDAGES/DRESSINGS) ×3 IMPLANT
CABLE HIGH FREQUENCY MONO STRZ (ELECTRODE) ×3 IMPLANT
CHLORAPREP W/TINT 26ML (MISCELLANEOUS) ×6 IMPLANT
CLIP APPLIE ROT 10 11.4 M/L (STAPLE) ×1 IMPLANT
CLOSURE WOUND 1/2 X4 (GAUZE/BANDAGES/DRESSINGS) ×1
COVER MAYO STAND STRL (DRAPES) ×3 IMPLANT
COVER SURGICAL LIGHT HANDLE (MISCELLANEOUS) ×3 IMPLANT
DECANTER SPIKE VIAL GLASS SM (MISCELLANEOUS) IMPLANT
DRAPE C-ARM 42X120 X-RAY (DRAPES) ×3 IMPLANT
ELECT REM PT RETURN 9FT ADLT (ELECTROSURGICAL) ×3
ELECTRODE REM PT RTRN 9FT ADLT (ELECTROSURGICAL) ×1 IMPLANT
GAUZE SPONGE 2X2 8PLY STRL LF (GAUZE/BANDAGES/DRESSINGS) ×1 IMPLANT
GLOVE SURG ORTHO 8.0 STRL STRW (GLOVE) ×3 IMPLANT
GOWN STRL REUS W/TWL XL LVL3 (GOWN DISPOSABLE) ×12 IMPLANT
HEMOSTAT SURGICEL 4X8 (HEMOSTASIS) IMPLANT
HOVERMATT SINGLE USE (MISCELLANEOUS) ×3 IMPLANT
IRRIG SUCT STRYKERFLOW 2 WTIP (MISCELLANEOUS) ×3
IRRIGATION SUCT STRKRFLW 2 WTP (MISCELLANEOUS) ×1 IMPLANT
KIT BASIN OR (CUSTOM PROCEDURE TRAY) ×3 IMPLANT
POUCH SPECIMEN RETRIEVAL 10MM (ENDOMECHANICALS) ×3 IMPLANT
SCISSORS LAP 5X35 DISP (ENDOMECHANICALS) ×3 IMPLANT
SET CHOLANGIOGRAPH MIX (MISCELLANEOUS) ×3 IMPLANT
SLEEVE XCEL OPT CAN 5 100 (ENDOMECHANICALS) ×3 IMPLANT
SPONGE GAUZE 2X2 STER 10/PKG (GAUZE/BANDAGES/DRESSINGS) ×2
STRIP CLOSURE SKIN 1/2X4 (GAUZE/BANDAGES/DRESSINGS) ×2 IMPLANT
SUT MNCRL AB 4-0 PS2 18 (SUTURE) ×6 IMPLANT
TAPE CLOTH SURG 4X10 WHT LF (GAUZE/BANDAGES/DRESSINGS) ×3 IMPLANT
TOWEL OR 17X26 10 PK STRL BLUE (TOWEL DISPOSABLE) ×3 IMPLANT
TOWEL OR NON WOVEN STRL DISP B (DISPOSABLE) ×3 IMPLANT
TRAY LAPAROSCOPIC (CUSTOM PROCEDURE TRAY) ×3 IMPLANT
TROCAR BLADELESS OPT 5 100 (ENDOMECHANICALS) ×3 IMPLANT
TROCAR XCEL BLUNT TIP 100MML (ENDOMECHANICALS) ×3 IMPLANT
TROCAR XCEL NON-BLD 11X100MML (ENDOMECHANICALS) ×3 IMPLANT
TUBING INSUF HEATED (TUBING) ×3 IMPLANT

## 2016-06-29 NOTE — Discharge Instructions (Signed)
°  [facility] ° °LAPAROSCOPIC SURGERY:  POST-OP INSTRUCTIONS ° °Always review your discharge instruction sheet given to you by the facility where your surgery was performed. ° °A prescription for pain medication may be given to you upon discharge.  Take your pain medication as prescribed.  If narcotic pain medicine is not needed, then you may take acetaminophen (Tylenol) or ibuprofen (Advil) as needed. ° °Take your usually prescribed medications unless otherwise directed. ° °If you need a refill on your pain medication, please contact your pharmacy.  They will contact our office to request authorization. Prescriptions will not be filled after 5 P.M. or on weekends. ° °You should follow a light diet the first few days after arrival home, such as soup and crackers or toast.  Be sure to include plenty of fluids daily. ° °Most patients will experience some swelling and bruising in the area of the incisions.  Ice packs will help.  Swelling and bruising can take several days to resolve.  ° °It is common to experience some constipation after surgery.  Increasing fluid intake and taking a stool softener (such as Colace) will usually help or prevent this problem from occurring.  A mild laxative (Milk of Magnesia or Miralax) should be taken according to package instructions if there has been no bowel movement after 48 hours. ° °You will have steri-strips and a gauze dressing over your incisions.  You may remove the gauze bandage on the second day after surgery, and you may shower at that time.  Leave your steri-strips (small skin tapes) in place directly over the incision.  These strips should remain on the skin for 5-7 days and then be removed.  You may get them wet in the shower and pat them dry. ° °Any sutures or staples will be removed at the office during your follow-up visit. ° °ACTIVITIES:  You may resume regular (light) daily activities beginning the next day - such as daily self-care, walking,  climbing stairs - gradually increasing activities as tolerated.  You may have sexual intercourse when it is comfortable.  Refrain from any heavy lifting or straining until approved by your doctor. ° °You may drive when you are no longer taking prescription pain medication, you can comfortably wear a seatbelt, and you can safely maneuver your car and apply brakes. ° °You should see your doctor in the office for a follow-up appointment approximately 2-3 weeks after your surgery.  Make sure that you call for this appointment within a day or two after you arrive home to insure a convenient appointment time. ° °WHEN TO CALL YOUR DOCTOR: °1. Fever over 101.0 °2. Inability to urinate °3. Continued bleeding from incision °4. Increased pain, redness, or drainage from the incision °5. Increasing abdominal pain ° °The clinic staff is available to answer your questions during regular business hours.  Please don’t hesitate to call and ask to speak to one of the nurses for clinical concerns.  If you have a medical emergency, go to the nearest emergency room or call 911.  A surgeon from [facility] is always on call for the hospital. ° °Trystan Akhtar M. Finlay Godbee, MD, FACS °[facility] °Office: [phone] °Toll Free:  [phone] °FAX [phone] ° °Website: [email or web address] °

## 2016-06-29 NOTE — Interval H&P Note (Signed)
History and Physical Interval Note:  06/29/2016 7:10 AM  Latasha Decker  has presented today for surgery, with the diagnosis of SYMPTOMATIC CHOLELITHIASIS.  The various methods of treatment have been discussed with the patient and family. After consideration of risks, benefits and other options for treatment, the patient has consented to    Procedure(s): LAPAROSCOPIC CHOLECYSTECTOMY WITH INTRAOPERATIVE CHOLANGIOGRAM (N/A) as a surgical intervention .    The patient's history has been reviewed, patient examined, no change in status, stable for surgery.  I have reviewed the patient's chart and labs.  Questions were answered to the patient's satisfaction.    Velora Hecklerodd M. Sole Lengacher, MD, [facility] Office: [phone]    [name] MJudie Petit

## 2016-06-29 NOTE — Op Note (Signed)
Procedure Note  Pre-operative Diagnosis:  Abdominal pain, cholelithiasis  Post-operative Diagnosis:  same  Surgeon:  Velora Hecklerodd M. Damoney Julia, MD, FACS  Assistant:  none   Procedure:  Laparoscopic cholecystectomy  Anesthesia:  General  Estimated Blood Loss:  minimal  Drains: none         Specimen: Gallbladder to pathology  Indications:  The patient is a 20 year old female who presents for evaluation of gall stones.  Patient is referred by Charlestine Nighthristopher Lawyer at the emergency department at [facility] for surgical evaluation for symptomatic cholelithiasis. Patient has a history of intermittent back pain and intermittent upper abdominal pain. She was evaluated in the emergency department. Unfortunately there is no record from the treating physician in the emergency department available for review. Patient did undergo an abdominal ultrasound on May 27, 2016. This shows a gallbladder essentially filled with numerous gallstones. No biliary dilatation.   Procedure Details:  The patient was seen in the pre-op holding area. The risks, benefits, complications, treatment options, and expected outcomes were previously discussed with the patient. The patient agreed with the proposed plan and has signed the informed consent form.  The patient was brought to the Operating Room, identified as Madaline GuthrieEbonee Moening and the procedure verified as laparoscopic cholecystectomy with intraoperative cholangiography. A "time out" was completed and the above information confirmed.  The patient was placed in the supine position. Following induction of general anesthesia, the abdomen was prepped and draped in the usual aseptic fashion.  An incision was made in the skin near the umbilicus. The midline fascia was incised and the peritoneal cavity was entered and a Hasson canula was introduced under direct vision.  The Hasson canula was secured with a 0-Vicryl pursestring suture. Pneumoperitoneum was established  with carbon dioxide. Additional trocars were introduced under direct vision along the right costal margin in the midline, mid-clavicular line, and anterior axillary line.   The gallbladder was identified and the fundus grasped and retracted cephalad. Adhesions were taken down bluntly and the electrocautery was utilized as needed, taking care not to injure any adjacent structures. The infundibulum was grasped and retracted laterally, exposing the peritoneum overlying the triangle of Calot. The peritoneum was incised and structures exposed with blunt dissection. The cystic duct was clearly identified, bluntly dissected circumferentially, and clipped at the neck of the gallbladder.  Due to the patient's size and body habitus, it was difficult to expose the distal cystic duct.  Therefore a decision was made not perform cholangiography.  The cystic duct was then ligated with surgical clips and divided. The cystic artery was identified, dissected circumferentially, ligated with ligaclips, and divided.  The gallbladder was dissected away from the liver bed using the electrocautery for hemostasis. The gallbladder was completely removed from the liver and placed into an endocatch bag. The gallbladder was removed in the endocatch bag through the umbilical port site and submitted to pathology for review.  The right upper quadrant was irrigated and the gallbladder bed was inspected. Hemostasis was achieved with the electrocautery.  Pneumoperitoneum was released after viewing removal of the trocars with good hemostasis noted. The umbilical wound was irrigated and the fascia was then closed with the pursestring suture.  Local anesthetic was infiltrated at all port sites. The skin incisions were closed with 4-0 Monocril subcuticular sutures and steri-strips and dressings were applied.  Instrument, sponge, and needle counts were correct at the conclusion of the case.  The patient was awakened from anesthesia and brought  to the recovery  room in stable condition.  The patient tolerated the procedure well.   Earnstine Regal, MD, [facility] Office: [phone]

## 2016-06-29 NOTE — Anesthesia Postprocedure Evaluation (Signed)
Anesthesia Post Note  Patient: Latasha Decker  Procedure(s) Performed: Procedure(s) (LRB): LAPAROSCOPIC CHOLECYSTECTOMY (N/A)  Patient location during evaluation: PACU Anesthesia Type: General Level of consciousness: awake and alert Pain management: pain level controlled Vital Signs Assessment: post-procedure vital signs reviewed and stable Respiratory status: spontaneous breathing, nonlabored ventilation, respiratory function stable and patient connected to nasal cannula oxygen Cardiovascular status: blood pressure returned to baseline and stable Postop Assessment: no signs of nausea or vomiting Anesthetic complications: no    Last Vitals:  Vitals:   06/29/16 0915 06/29/16 1000  BP: 127/76 (!) 142/100  Pulse: 69 64  Resp: (!) 22 18  Temp:  36.9 C    Last Pain:  Vitals:   06/29/16 1030  TempSrc:   PainSc: 9                  Latasha Decker

## 2016-06-29 NOTE — Anesthesia Procedure Notes (Signed)
Procedure Name: Intubation Date/Time: 06/29/2016 7:39 AM Performed by: Enriqueta ShutterWILLIFORD, Jaydy Fitzhenry D Pre-anesthesia Checklist: Patient identified, Emergency Drugs available, Suction available and Patient being monitored Patient Re-evaluated:Patient Re-evaluated prior to inductionOxygen Delivery Method: Circle system utilized Preoxygenation: Pre-oxygenation with 100% oxygen Intubation Type: IV induction Ventilation: Mask ventilation without difficulty Laryngoscope Size: Mac and 4 Grade View: Grade I Tube type: Oral Tube size: 7.5 mm Number of attempts: 1 Airway Equipment and Method: Stylet Placement Confirmation: ETT inserted through vocal cords under direct vision,  positive ETCO2 and breath sounds checked- equal and bilateral Secured at: 21 cm Tube secured with: Tape Dental Injury: Teeth and Oropharynx as per pre-operative assessment

## 2016-06-29 NOTE — Anesthesia Preprocedure Evaluation (Signed)
Anesthesia Evaluation  Patient identified by MRN, date of birth, ID band Patient awake    Reviewed: Allergy & Precautions, NPO status , Patient's Chart, lab work & pertinent test results  Airway Mallampati: II  TM Distance: >3 FB Neck ROM: Full    Dental no notable dental hx.    Pulmonary asthma (childhood asthma, now well controlled. no recent use of inhaler) , Current Smoker,    Pulmonary exam normal        Cardiovascular negative cardio ROS Normal cardiovascular exam     Neuro/Psych negative neurological ROS  negative psych ROS   GI/Hepatic negative GI ROS, Neg liver ROS,   Endo/Other  Morbid obesity (super morbid obese)  Renal/GU negative Renal ROS  negative genitourinary   Musculoskeletal negative musculoskeletal ROS (+)   Abdominal   Peds negative pediatric ROS (+)  Hematology negative hematology ROS (+)   Anesthesia Other Findings   Reproductive/Obstetrics negative OB ROS                             Anesthesia Physical Anesthesia Plan  ASA: III  Anesthesia Plan: General   Post-op Pain Management:    Induction: Intravenous  Airway Management Planned: Oral ETT  Additional Equipment:   Intra-op Plan:   Post-operative Plan: Extubation in OR  Informed Consent: I have reviewed the patients History and Physical, chart, labs and discussed the procedure including the risks, benefits and alternatives for the proposed anesthesia with the patient or authorized representative who has indicated his/her understanding and acceptance.   Dental advisory given  Plan Discussed with: CRNA  Anesthesia Plan Comments:         Anesthesia Quick Evaluation

## 2016-06-29 NOTE — Transfer of Care (Signed)
Immediate Anesthesia Transfer of Care Note  Patient: Latasha Decker  Procedure(s) Performed: Procedure(s): LAPAROSCOPIC CHOLECYSTECTOMY (N/A)  Patient Location: PACU  Anesthesia Type:General  Level of Consciousness: awake, alert  and oriented  Airway & Oxygen Therapy: Patient Spontanous Breathing and Patient connected to face mask oxygen  Post-op Assessment: Report given to RN and Post -op Vital signs reviewed and stable  Post vital signs: Reviewed and stable  Last Vitals:  Vitals:   06/29/16 0549  BP: 110/70  Pulse: 84  Resp: 18  Temp: 36.9 C    Last Pain:  Vitals:   06/29/16 0549  TempSrc: Oral      Patients Stated Pain Goal: 4 (06/29/16 [id number])  Complications: No apparent anesthesia complications

## 2016-06-29 NOTE — Discharge Summary (Signed)
Physician Discharge Summary [facility]  Patient ID: Latasha Decker MRN: [id number] DOB/AGE: 1995-10-16 20 y.o.  Admit date: 06/29/2016 Discharge date: 06/29/2016  Admission Diagnoses:  Cholelithiasis, RUQ abd pain  Discharge Diagnoses:  Principal Problem:   Cholelithiasis Active Problems:   Abdominal pain, right upper quadrant   Discharged Condition: good  Hospital Course: patient admitted for observation after lap chole.  Did well post op, tolerating diet, ambulatory.  Prepared for discharge later on day of surgery.  Consults: None  Treatments: surgery: laparoscopic cholecystectomy  Discharge Exam: Blood pressure 112/71, pulse 79, temperature 98.2 F (36.8 C), temperature source Oral, resp. rate 18, height 5\' 4"  (1.626 m), weight (!) 161.5 kg (356 lb), last menstrual period 06/15/2016, SpO2 96 %. See op note and H&P  Disposition: Home  Discharge Instructions    Diet - low sodium heart healthy    Complete by:  As directed    Discharge instructions    Complete by:  As directed    [facility]  LAPAROSCOPIC SURGERY:  POST-OP INSTRUCTIONS  Always review your discharge instruction sheet given to you by the facility where your surgery was performed.  A prescription for pain medication may be given to you upon discharge.  Take your pain medication as prescribed.  If narcotic pain medicine is not needed, then you may take acetaminophen (Tylenol) or ibuprofen (Advil) as needed.  Take your usually prescribed medications unless otherwise directed.  If you need a refill on your pain medication, please contact your pharmacy.  They will contact our office to request authorization. Prescriptions will not be filled after 5 P.M. or on weekends.  You should follow a light diet the first few days after arrival home, such as soup and crackers or toast.  Be sure to include plenty of fluids daily.  Most patients will experience some swelling and  bruising in the area of the incisions.  Ice packs will help.  Swelling and bruising can take several days to resolve.   It is common to experience some constipation after surgery.  Increasing fluid intake and taking a stool softener (such as Colace) will usually help or prevent this problem from occurring.  A mild laxative (Milk of Magnesia or Miralax) should be taken according to package instructions if there has been no bowel movement after 48 hours.  You will have steri-strips and a gauze dressing over your incisions.  You may remove the gauze bandage on the second day after surgery, and you may shower at that time.  Leave your steri-strips (small skin tapes) in place directly over the incision.  These strips should remain on the skin for 5-7 days and then be removed.  You may get them wet in the shower and pat them dry.  Any sutures or staples will be removed at the office during your follow-up visit.  ACTIVITIES:  You may resume regular (light) daily activities beginning the next day - such as daily self-care, walking, climbing stairs - gradually increasing activities as tolerated.  You may have sexual intercourse when it is comfortable.  Refrain from any heavy lifting or straining until approved by your doctor.  You may drive when you are no longer taking prescription pain medication, you can comfortably wear a seatbelt, and you can safely maneuver your car and apply brakes.  You should see your doctor in the office for a follow-up appointment approximately 2-3 weeks after your surgery.  Make sure that you call for this appointment within a  day or two after you arrive home to insure a convenient appointment time.  WHEN TO CALL YOUR DOCTOR: Fever over 101.0 Inability to urinate Continued bleeding from incision Increased pain, redness, or drainage from the incision Increasing abdominal pain  The clinic staff is available to answer your questions during regular business hours.  Please don't  hesitate to call and ask to speak to one of the nurses for clinical concerns.  If you have a medical emergency, go to the nearest emergency room or call 911.  [facility] is always on call for the hospital.  Velora Hecklerodd M. Roni Friberg, MD, [facility] Office: [phone] Toll Free:  [phone] FAX [phone]  Website: [email or web address]   Increase activity slowly    Complete by:  As directed    Remove dressing in 48 hours    Complete by:  As directed        Medication List    TAKE these medications   HYDROcodone-acetaminophen 5-325 MG tablet Commonly known as:  NORCO/VICODIN Take 1-2 tablets by mouth every 4 (four) hours as needed for moderate pain.   ibuprofen 800 MG tablet Commonly known as:  ADVIL,MOTRIN Take 1 tablet (800 mg total) by mouth every 8 (eight) hours as needed.      Follow-up Information    Jianni Batten M, MD. Schedule an appointment as soon as possible for a visit in 3 week(s).   Specialty:  General Surgery Why:  For wound re-check Contact information: [address] [place] [address] [phone]           Velora Hecklerodd M. Tori Cupps, MD, [facility] Office: [phone]   Signed: Velora HecklerGERKIN,Kimble Delaurentis M 06/29/2016, 5:19 PM

## 2016-08-25 ENCOUNTER — Encounter (HOSPITAL_BASED_OUTPATIENT_CLINIC_OR_DEPARTMENT_OTHER): Payer: Self-pay | Admitting: Emergency Medicine

## 2016-08-25 ENCOUNTER — Emergency Department (HOSPITAL_BASED_OUTPATIENT_CLINIC_OR_DEPARTMENT_OTHER)
Admission: EM | Admit: 2016-08-25 | Discharge: 2016-08-25 | Disposition: A | Discharge status: Home / Self Care | Payer: BLUE CROSS/BLUE SHIELD | Attending: Emergency Medicine | Admitting: Emergency Medicine

## 2016-08-25 DIAGNOSIS — R51 Headache: Secondary | ICD-10-CM | POA: Insufficient documentation

## 2016-08-25 DIAGNOSIS — F17200 Nicotine dependence, unspecified, uncomplicated: Secondary | ICD-10-CM | POA: Insufficient documentation

## 2016-08-25 DIAGNOSIS — Z791 Long term (current) use of non-steroidal anti-inflammatories (NSAID): Secondary | ICD-10-CM | POA: Diagnosis not present

## 2016-08-25 DIAGNOSIS — Z79899 Other long term (current) drug therapy: Secondary | ICD-10-CM | POA: Insufficient documentation

## 2016-08-25 DIAGNOSIS — R110 Nausea: Secondary | ICD-10-CM | POA: Diagnosis not present

## 2016-08-25 DIAGNOSIS — R519 Headache, unspecified: Secondary | ICD-10-CM

## 2016-08-25 DIAGNOSIS — J45909 Unspecified asthma, uncomplicated: Secondary | ICD-10-CM | POA: Insufficient documentation

## 2016-08-25 MED ORDER — METOCLOPRAMIDE HCL 10 MG PO TABS: 10.0000 mg | ORAL_TABLET | Freq: Four times a day (QID) | ORAL | 0 refills | Status: DC

## 2016-08-25 MED ORDER — KETOROLAC TROMETHAMINE 30 MG/ML IJ SOLN
30.0000 mg | Freq: Once | INTRAMUSCULAR | Status: AC
  Administered 2016-08-25: 30 mg via INTRAMUSCULAR
  Filled 2016-08-25: qty 1

## 2016-08-25 MED ORDER — METOCLOPRAMIDE HCL 10 MG PO TABS
10.0000 mg | ORAL_TABLET | Freq: Once | ORAL | Status: AC
  Administered 2016-08-25: 10 mg via ORAL
  Filled 2016-08-25: qty 1

## 2016-08-25 NOTE — ED Provider Notes (Signed)
Emergency Department Provider Note  By signing my name below, I, Nelwyn SalisburyJoshua Fowler, attest that this documentation has been prepared under the direction and in the presence of Maia PlanJoshua G Long, MD . Electronically Signed: Nelwyn SalisburyJoshua Fowler, Scribe. 08/25/2016. 8:25 PM.  I have reviewed the triage vital signs and the nursing notes.  HISTORY  Chief Complaint Headache  HPI Latasha Decker is a 20 y.o. female with who presents to the Emergency Department complaining of sudden-onset waxing/waning headache beginning 7 days ago. Pt describes her headache as a 9/10 aching pain located in the middle of her forehead, exacerbated by light. She has tried ibuprofen, hydrocodone and tylenol with no relief. She reports associated nausea and cough. Pt denies any visual changes, numbness, weakness, gait problems, fever or chills.  Past Medical History:  Diagnosis Date  . Asthma   . Headache    occasional severe headache   . Morbid obesity (HCC)   . Pneumonia    hx of in 2012     Patient Active Problem List   Diagnosis Date Noted  . Abdominal pain, right upper quadrant 06/28/2016  . Cholelithiasis 06/28/2016    Past Surgical History:  Procedure Laterality Date  . CHOLECYSTECTOMY N/A 06/29/2016   Procedure: LAPAROSCOPIC CHOLECYSTECTOMY;  Surgeon: Darnell Levelodd Gerkin, MD;  Location: [facility];  Service: General;  Laterality: N/A;  . MOUTH SURGERY    . ORIF TIBIA FRACTURE      Current Outpatient Rx  . Order #: [id number] Class: Print  . Order #: [id number] Class: Print  . Order #: [id number] Class: Print    Allergies Peanuts [nuts]  Family History  Problem Relation Age of Onset  . Other Neg Hx     Social History Social History  Substance Use Topics  . Smoking status: Current Every Day Smoker    Packs/day: 0.25    Years: 4.00  . Smokeless tobacco: Never Used  . Alcohol use Yes     Comment: occasionally    Review of Systems  Constitutional: No fever/chills Eyes: Positive for photophobia. No visual  changes. ENT: No sore throat. Cardiovascular: Denies chest pain. Respiratory: Positive for cough. Denies shortness of breath. Gastrointestinal: Positive for nausea. No abdominal pain. No vomiting.  No diarrhea.  No constipation. Genitourinary: Negative for dysuria. Musculoskeletal: Negative for back pain. Skin: Negative for rash. Neurological: Positive for headaches. Negative focal weakness or numbness. Negative for gait problem.  10-point ROS otherwise negative.  ____________________________________________   PHYSICAL EXAM:  VITAL SIGNS: ED Triage Vitals  Enc Vitals Group     BP 08/25/16 1935 125/85     Pulse Rate 08/25/16 1935 79     Resp 08/25/16 1935 18     Temp 08/25/16 1935 98.5 F (36.9 C)     Temp Source 08/25/16 1935 Oral     SpO2 08/25/16 1935 99 %     Weight 08/25/16 1935 (!) 356 lb (161.5 kg)     Height 08/25/16 1935 5\' 4"  (1.626 m)     Pain Score 08/25/16 1933 9   Constitutional: Alert and oriented. Well appearing and in no acute distress. Eyes: Conjunctivae are normal. PERRL. EOMI. Head: Atraumatic. Nose: No congestion/rhinnorhea. Mouth/Throat: Mucous membranes are moist.  Oropharynx non-erythematous. Neck: No stridor.  No meningismus.  Cardiovascular: Normal rate, regular rhythm. Good peripheral circulation. Grossly normal heart sounds.   Respiratory: Normal respiratory effort.  No retractions. Lungs CTAB. Gastrointestinal: Soft and nontender. No distention.  Musculoskeletal: No lower extremity tenderness nor edema. No gross deformities of extremities. Neurologic:  Normal speech  and language. No gross focal neurologic deficits are appreciated. Normal gait. No pronator drift.  Skin:  Skin is warm, dry and intact. No rash noted.   ____________________________________________   PROCEDURES  Procedure(s) performed:   Procedures  None ____________________________________________   INITIAL IMPRESSION / ASSESSMENT AND PLAN / ED COURSE  Pertinent  labs & imaging results that were available during my care of the patient were reviewed by me and considered in my medical decision making (see chart for details).  Patient with intermittent HA for the last week. No sudden onset, maximal intensity symptoms. No vision changes or neuro deficits to suggest mass or pseudotumor. Low suspicion for these ddx. No fever or chills. Patient with some mild associated URI symptoms. Suspect intermittent tension HA. Symptoms improved with medication in the ED. Plan for supportive care at home with PCP follow up and HA diary. Gave contact information for Neurology PRN.   At this time, I do not feel there is any life-threatening condition present. I have reviewed and discussed all results (EKG, imaging, lab, urine as appropriate), exam findings with patient. I have reviewed nursing notes and appropriate previous records.  I feel the patient is safe to be discharged home without further emergent workup. Discussed usual and customary return precautions. Patient and family (if present) verbalize understanding and are comfortable with this plan.  Patient will follow-up with their primary care provider. If they do not have a primary care provider, information for follow-up has been provided to them. All questions have been answered.  ____________________________________________  FINAL CLINICAL IMPRESSION(S) / ED DIAGNOSES  Final diagnoses:  Acute nonintractable headache, unspecified headache type     MEDICATIONS GIVEN DURING THIS VISIT:  Medications  ketorolac (TORADOL) 30 MG/ML injection 30 mg (30 mg Intramuscular Given 08/25/16 2052)  metoCLOPramide (REGLAN) tablet 10 mg (10 mg Oral Given 08/25/16 2052)     NEW OUTPATIENT MEDICATIONS STARTED DURING THIS VISIT:  Discharge Medication List as of 08/25/2016  9:57 PM    START taking these medications   Details  metoCLOPramide (REGLAN) 10 MG tablet Take 1 tablet (10 mg total) by mouth every 6 (six) hours., Starting  Tue 08/25/2016, Print        Note:  This document was prepared using Dragon voice recognition software and may include unintentional dictation errors.  Alona BeneJoshua Long, MD Emergency Medicine  I personally performed the services described in this documentation, which was scribed in my presence. The recorded information has been reviewed and is accurate.       Maia PlanJoshua G Long, MD 08/26/16 [phone]

## 2016-08-25 NOTE — ED Triage Notes (Signed)
Patient states that she has had a Headache for a week  - the patient reports that she had taken some left over hydrocodone, Motrin and Tylenol

## 2016-08-25 NOTE — Discharge Instructions (Signed)

## 2016-08-25 NOTE — ED Notes (Signed)
Pt. Reports she uses a computer at work.  Pt. Has no trouble with her eyes and is not having nausea.

## 2016-09-26 ENCOUNTER — Encounter (HOSPITAL_BASED_OUTPATIENT_CLINIC_OR_DEPARTMENT_OTHER): Payer: Self-pay | Admitting: Emergency Medicine

## 2016-09-26 ENCOUNTER — Emergency Department (HOSPITAL_BASED_OUTPATIENT_CLINIC_OR_DEPARTMENT_OTHER): Payer: BLUE CROSS/BLUE SHIELD

## 2016-09-26 ENCOUNTER — Emergency Department (HOSPITAL_BASED_OUTPATIENT_CLINIC_OR_DEPARTMENT_OTHER)
Admission: EM | Admit: 2016-09-26 | Discharge: 2016-09-26 | Disposition: A | Discharge status: Home / Self Care | Payer: BLUE CROSS/BLUE SHIELD | Attending: Emergency Medicine | Admitting: Emergency Medicine

## 2016-09-26 DIAGNOSIS — J45909 Unspecified asthma, uncomplicated: Secondary | ICD-10-CM | POA: Diagnosis not present

## 2016-09-26 DIAGNOSIS — J988 Other specified respiratory disorders: Secondary | ICD-10-CM | POA: Insufficient documentation

## 2016-09-26 DIAGNOSIS — Z79899 Other long term (current) drug therapy: Secondary | ICD-10-CM | POA: Insufficient documentation

## 2016-09-26 DIAGNOSIS — R059 Cough, unspecified: Secondary | ICD-10-CM

## 2016-09-26 DIAGNOSIS — Z87891 Personal history of nicotine dependence: Secondary | ICD-10-CM | POA: Diagnosis not present

## 2016-09-26 DIAGNOSIS — R05 Cough: Secondary | ICD-10-CM | POA: Diagnosis present

## 2016-09-26 MED ORDER — IBUPROFEN 400 MG PO TABS
600.0000 mg | ORAL_TABLET | Freq: Once | ORAL | Status: AC
  Administered 2016-09-26: 600 mg via ORAL
  Filled 2016-09-26: qty 1

## 2016-09-26 MED ORDER — BENZONATATE 100 MG PO CAPS: 100.0000 mg | ORAL_CAPSULE | Freq: Three times a day (TID) | ORAL | 0 refills | Status: DC

## 2016-09-26 MED ORDER — ACETAMINOPHEN 325 MG PO TABS
650.0000 mg | ORAL_TABLET | Freq: Once | ORAL | Status: AC | PRN
  Administered 2016-09-26: 650 mg via ORAL
  Filled 2016-09-26: qty 2

## 2016-09-26 NOTE — ED Notes (Signed)
Pt given d/c instructions as per chart. Rx x 1. Verbalizes understanding. No questions. 

## 2016-09-26 NOTE — ED Triage Notes (Signed)
Cough, congestion x 1 week. Taking mucinex and nyquil without relief

## 2016-09-26 NOTE — ED Notes (Signed)
ED Provider at bedside. 

## 2016-09-26 NOTE — Discharge Instructions (Signed)
Please read and follow all provided instructions.  Your diagnoses today include:  1. Cough   2. Respiratory infection    Tests performed today include:  Vital signs. See below for your results today.   Chest x-ray - no sign of pneumonia or other problems  Medications prescribed:   Tessalon Perles - cough suppressant medication  Take any prescribed medications only as directed. Treatment for your infection is aimed at treating the symptoms. There are no medications, such as antibiotics, that will cure your infection.   Home care instructions:  Follow any educational materials contained in this packet.   Your illness is contagious and can be spread to others, especially during the first 3 or 4 days. It cannot be cured by antibiotics or other medicines. Take basic precautions such as washing your hands often, covering your mouth when you cough or sneeze, and avoiding public places where you could spread your illness to others.   Please continue drinking plenty of fluids.  Use over-the-counter medicines as needed as directed on packaging for symptom relief.  You may also use ibuprofen or tylenol as directed on packaging for pain or fever.  Do not take multiple medicines containing Tylenol or acetaminophen to avoid taking too much of this medication.  Follow-up instructions: Please follow-up with your primary care provider in the next 3 days for further evaluation of your symptoms if you are not feeling better.   Return instructions:   Please return to the Emergency Department if you experience worsening symptoms.   RETURN IMMEDIATELY IF you develop shortness of breath, confusion or altered mental status, a new rash, become dizzy, faint, or poorly responsive, or are unable to be cared for at home.  Please return if you have persistent vomiting and cannot keep down fluids or develop a fever that is not controlled by tylenol or motrin.    Please return if you have any other emergent  concerns.  Additional Information:  Your vital signs today were: BP 113/71 (BP Location: Right Arm)    Pulse 108    Temp 100 F (37.8 C) (Oral)    Resp 20    Ht 5\' 4"  (1.626 m)    Wt (!) 157.9 kg    LMP 09/16/2016    SpO2 98%    BMI 59.73 kg/m  If your blood pressure (BP) was elevated above 135/85 this visit, please have this repeated by your doctor within one month. --------------

## 2016-09-26 NOTE — ED Provider Notes (Signed)
[facility] Provider Note   CSN: [id number] Arrival date & time: 09/26/16  1420  By signing my name below, I, Soijett Blue, attest that this documentation has been prepared under the direction and in the presence of Rhea BleacherJosh Stclair Szymborski, PA-C Electronically Signed: Soijett Blue, ED Scribe. 09/26/16. 4:44 PM.  History   Chief Complaint Chief Complaint  Patient presents with  . Cough    HPI Latasha Decker is a 21 y.o. female who presents to the Emergency Department complaining of productive cough onset 1 week ago. Pt symptoms began with night sweats and a sore throat. She is having associated symptoms of nasal congestion, intermittent fever, chest wall tenderness due to cough, and nausea. She has tried mucinex and nyquil with no relief for her symptoms. She denies vomiting, generalized body aches, diarrhea, leg swelling, and any other symptoms. Pt has a hx of asthma and no inhaler at this time. Denies wheezing. Denies being immunosuppressed, recent travel, leg swelling, hx of DVT, or PE.   The history is provided by the patient. No language interpreter was used.    Past Medical History:  Diagnosis Date  . Asthma   . Headache    occasional severe headache   . Morbid obesity (HCC)   . Pneumonia    hx of in 2012     Patient Active Problem List   Diagnosis Date Noted  . Abdominal pain, right upper quadrant 06/28/2016  . Cholelithiasis 06/28/2016    Past Surgical History:  Procedure Laterality Date  . CHOLECYSTECTOMY N/A 06/29/2016   Procedure: LAPAROSCOPIC CHOLECYSTECTOMY;  Surgeon: Darnell Levelodd Gerkin, MD;  Location: [facility];  Service: General;  Laterality: N/A;  . MOUTH SURGERY    . ORIF TIBIA FRACTURE      OB History    No data available       Home Medications    Prior to Admission medications   Medication Sig Start Date End Date Taking? Authorizing Provider  HYDROcodone-acetaminophen (NORCO/VICODIN) 5-325 MG tablet Take 1-2 tablets by mouth every 4 (four) hours as  needed for moderate pain. 06/29/16   Darnell Levelodd Gerkin, MD  ibuprofen (ADVIL,MOTRIN) 800 MG tablet Take 1 tablet (800 mg total) by mouth every 8 (eight) hours as needed. 05/27/16   Charlestine Nighthristopher Lawyer, PA-C  metoCLOPramide (REGLAN) 10 MG tablet Take 1 tablet (10 mg total) by mouth every 6 (six) hours. 08/25/16   Maia PlanJoshua G Long, MD    Family History Family History  Problem Relation Age of Onset  . Other Neg Hx     Social History Social History  Substance Use Topics  . Smoking status: Former Smoker    Packs/day: 0.25    Years: 4.00  . Smokeless tobacco: Never Used  . Alcohol use Yes     Comment: occasionally     Allergies   Peanuts [nuts]   Review of Systems Review of Systems  Constitutional: Positive for fever. Negative for chills and fatigue.       +night sweats  HENT: Positive for congestion and sore throat. Negative for ear pain, rhinorrhea and sinus pressure.   Eyes: Negative for redness.  Respiratory: Positive for cough (productive). Negative for wheezing.        +chest wall tenderness due to cough  Cardiovascular: Negative for leg swelling.  Gastrointestinal: Positive for nausea. Negative for abdominal pain, diarrhea and vomiting.  Genitourinary: Negative for dysuria.  Musculoskeletal: Negative for myalgias and neck stiffness.  Skin: Negative for rash.  Neurological: Negative for headaches.  Hematological: Negative for adenopathy.  Physical Exam Updated Vital Signs BP 108/63 (BP Location: Right Arm)   Pulse 115   Temp 99.7 F (37.6 C) (Oral)   Resp 18   Ht 5\' 4"  (1.626 m)   Wt (!) 348 lb (157.9 kg)   LMP 09/16/2016   SpO2 97%   BMI 59.73 kg/m   Physical Exam  Constitutional: She appears well-developed and well-nourished. No distress.  HENT:  Head: Normocephalic and atraumatic.  Right Ear: Tympanic membrane, external ear and ear canal normal.  Left Ear: Tympanic membrane, external ear and ear canal normal.  Nose: Nose normal. No mucosal edema or rhinorrhea.   Mouth/Throat: Uvula is midline, oropharynx is clear and moist and mucous membranes are normal. Mucous membranes are not dry. No oral lesions. No trismus in the jaw. No uvula swelling. No oropharyngeal exudate, posterior oropharyngeal edema, posterior oropharyngeal erythema or tonsillar abscesses.  Eyes: Conjunctivae and EOM are normal. Right eye exhibits no discharge. Left eye exhibits no discharge.  Neck: Normal range of motion. Neck supple.  Cardiovascular: Regular rhythm and normal heart sounds.  Tachycardia present.   Mild tachycardia, 110  Pulmonary/Chest: Effort normal and breath sounds normal. No respiratory distress. She has no wheezes. She has no rales.  Abdominal: Soft. She exhibits no distension. There is no tenderness.  Musculoskeletal: Normal range of motion.  Lymphadenopathy:    She has no cervical adenopathy.  Neurological: She is alert.  Skin: Skin is warm and dry.  Psychiatric: She has a normal mood and affect. Her behavior is normal.  Nursing note and vitals reviewed.    ED Treatments / Results  DIAGNOSTIC STUDIES: Oxygen Saturation is 97% on RA, nl by my interpretation.    COORDINATION OF CARE: 4:44 PM Discussed treatment plan with pt at bedside which includes tylenol, CXR and pt agreed to plan.   Radiology Dg Chest 2 View  Result Date: 09/26/2016 CLINICAL DATA:  Cough and congestion for 1 week. EXAM: CHEST  2 VIEW COMPARISON:  PA and lateral chest 12/10/2015. FINDINGS: Lungs are clear. Heart size is normal. No pneumothorax or pleural effusion. No bony abnormality. IMPRESSION: Negative chest. Electronically Signed   By: Drusilla Kanner M.D.   On: 09/26/2016 16:58    Procedures Procedures (including critical care time)  Medications Ordered in ED Medications  acetaminophen (TYLENOL) tablet 650 mg (650 mg Oral Given 09/26/16 1435)     Initial Impression / Assessment and Plan / ED Course  I have reviewed the triage vital signs and the nursing  notes.  Pertinent imaging results that were available during my care of the patient were reviewed by me and considered in my medical decision making (see chart for details).  Clinical Course    5:51 PM Pt informed of results. Will discharge to home with Tessalon. Her vital signs are improving. Temperature is still 100.36F. Will give ibuprofen prior to discharge. Patient encouraged to return with worsening symptoms, worsening shortness of breath or difficulty breathing, chest pain that is persistent, new symptoms or other concerns. She verbalizes understanding and agrees with plan.  Final Clinical Impressions(s) / ED Diagnoses   Final diagnoses:  Cough  Respiratory infection   Patient with likely lower respiratory infection. Differential includes acute bronchitis and influenza. She has no clinical features suggestive of DVT/PE. No clinical features of fluid overload. Chest x-ray does not show pneumonia or pulmonary edema. Patient presents with mild fever and tachycardia. This is improving in emergency department with antipyretics. Do not feel that she is septic at this time.  She appears well, nontoxic. No respiratory distress. No wheezing or other adventitious breath sounds on exam. Feel safe for discharge to home at this time.  New Prescriptions New Prescriptions   BENZONATATE (TESSALON) 100 MG CAPSULE    Take 1 capsule (100 mg total) by mouth every 8 (eight) hours.   I personally performed the services described in this documentation, which was scribed in my presence. The recorded information has been reviewed and is accurate.     Renne Crigler, PA-C 09/26/16 1753    Gwyneth Sprout, MD 09/26/16 [phone]

## 2016-10-24 ENCOUNTER — Encounter (HOSPITAL_COMMUNITY): Payer: Self-pay | Admitting: *Deleted

## 2016-10-24 ENCOUNTER — Inpatient Hospital Stay (HOSPITAL_COMMUNITY): Payer: BLUE CROSS/BLUE SHIELD

## 2016-10-24 ENCOUNTER — Inpatient Hospital Stay (HOSPITAL_COMMUNITY)
Admission: AD | Admit: 2016-10-24 | Discharge: 2016-10-25 | Disposition: A | Discharge status: Home / Self Care | Payer: BLUE CROSS/BLUE SHIELD | Source: Ambulatory Visit | Attending: Obstetrics & Gynecology | Admitting: Obstetrics & Gynecology

## 2016-10-24 DIAGNOSIS — R109 Unspecified abdominal pain: Secondary | ICD-10-CM | POA: Diagnosis not present

## 2016-10-24 DIAGNOSIS — O26891 Other specified pregnancy related conditions, first trimester: Secondary | ICD-10-CM | POA: Diagnosis not present

## 2016-10-24 DIAGNOSIS — O3680X0 Pregnancy with inconclusive fetal viability, not applicable or unspecified: Secondary | ICD-10-CM

## 2016-10-24 DIAGNOSIS — O26899 Other specified pregnancy related conditions, unspecified trimester: Secondary | ICD-10-CM

## 2016-10-24 LAB — CBC
HEMATOCRIT: 36.8 % (ref 36.0–46.0)
HEMOGLOBIN: 12.3 g/dL (ref 12.0–15.0)
MCH: 28.9 pg (ref 26.0–34.0)
MCHC: 33.4 g/dL (ref 30.0–36.0)
MCV: 86.6 fL (ref 78.0–100.0)
Platelets: 245 10*3/uL (ref 150–400)
RBC: 4.25 MIL/uL (ref 3.87–5.11)
RDW: 14.5 % (ref 11.5–15.5)
WBC: 14.4 10*3/uL — ABNORMAL HIGH (ref 4.0–10.5)

## 2016-10-24 LAB — URINALYSIS, ROUTINE W REFLEX MICROSCOPIC
Bilirubin Urine: NEGATIVE
GLUCOSE, UA: NEGATIVE mg/dL
HGB URINE DIPSTICK: NEGATIVE
Ketones, ur: 20 mg/dL — AB
Leukocytes, UA: NEGATIVE
Nitrite: NEGATIVE
PROTEIN: NEGATIVE mg/dL
Specific Gravity, Urine: 1.02 (ref 1.005–1.030)
pH: 6 (ref 5.0–8.0)

## 2016-10-24 LAB — POCT PREGNANCY, URINE: Preg Test, Ur: POSITIVE — AB

## 2016-10-24 LAB — HCG, QUANTITATIVE, PREGNANCY: hCG, Beta Chain, Quant, S: 930 m[IU]/mL — ABNORMAL HIGH (ref <5)

## 2016-10-24 NOTE — MAU Note (Signed)
Took 2 pregnancy tests and they were positive. Just want to be sure. No bleeding. Some abd pain in lower abd. No vag d/c. LMP 09/16/16

## 2016-10-24 NOTE — MAU Provider Note (Signed)
History     CSN: [id number]  Arrival date and time: 10/24/16 2203   First Provider Initiated Contact with Patient 10/24/16 2244      Chief Complaint  Patient presents with  . Possible Pregnancy   HPI   [name] is a 21 y.o. female G1 @ unknown here in MAU for pregnancy verification. She had 2 positive pregnancy tests at home. She complains of lower abdomina pain X 2-3 weeks. She denies vaginal bleeding. She has not taken anything for the pain.She thought the pain was her period starting.   LMP December 27   OB History    No data available      Past Medical History:  Diagnosis Date  . Asthma   . Headache    occasional severe headache   . Morbid obesity (HCC)   . Pneumonia    hx of in 2012     Past Surgical History:  Procedure Laterality Date  . CHOLECYSTECTOMY N/A 06/29/2016   Procedure: LAPAROSCOPIC CHOLECYSTECTOMY;  Surgeon: Darnell Levelodd Gerkin, MD;  Location: [facility];  Service: General;  Laterality: N/A;  . MOUTH SURGERY    . ORIF TIBIA FRACTURE      Family History  Problem Relation Age of Onset  . Other Neg Hx     Social History  Substance Use Topics  . Smoking status: Former Smoker    Packs/day: 0.25    Years: 4.00  . Smokeless tobacco: Never Used  . Alcohol use Yes     Comment: occasionally    Allergies:  Allergies  Allergen Reactions  . 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 benzonatate (TESSALON) 100 MG capsule Take 1 capsule (100 mg total) by mouth every 8 (eight) hours. 15 capsule 0   . HYDROcodone-acetaminophen (NORCO/VICODIN) 5-325 MG tablet Take 1-2 tablets by mouth every 4 (four) hours as needed for moderate pain. 20 tablet 0   . ibuprofen (ADVIL,MOTRIN) 800 MG tablet Take 1 tablet (800 mg total) by mouth every 8 (eight) hours as needed. 21 tablet 0 Past Week at Unknown time  . metoCLOPramide (REGLAN) 10 MG tablet Take 1 tablet (10 mg  total) by mouth every 6 (six) hours. 30 tablet 0    Results for orders placed or performed during the hospital encounter of 10/24/16 (from the past 48 hour(s))  Urinalysis, Routine w reflex microscopic     Status: Abnormal   Collection Time: 10/24/16 10:10 PM  Result Value Ref Range   Color, Urine YELLOW YELLOW   APPearance HAZY (A) CLEAR   Specific Gravity, Urine 1.020 1.005 - 1.030   pH 6.0 5.0 - 8.0   Glucose, UA NEGATIVE NEGATIVE mg/dL   Hgb urine dipstick NEGATIVE NEGATIVE   Bilirubin Urine NEGATIVE NEGATIVE   Ketones, ur 20 (A) NEGATIVE mg/dL   Protein, ur NEGATIVE NEGATIVE mg/dL   Nitrite NEGATIVE NEGATIVE   Leukocytes, UA NEGATIVE NEGATIVE  Pregnancy, urine POC     Status: Abnormal   Collection Time: 10/24/16 10:22 PM  Result Value Ref Range   Preg Test, Ur POSITIVE (A) NEGATIVE    Comment:        THE SENSITIVITY OF THIS METHODOLOGY IS >24 mIU/mL   CBC     Status: Abnormal   Collection Time: 10/24/16 11:01 PM  Result Value Ref Range   WBC 14.4 (H) 4.0 - 10.5 K/uL   RBC 4.25 3.87 - 5.11 MIL/uL  Hemoglobin 12.3 12.0 - 15.0 g/dL   HCT 16.1 09.6 - 04.5 %   MCV 86.6 78.0 - 100.0 fL   MCH 28.9 26.0 - 34.0 pg   MCHC 33.4 30.0 - 36.0 g/dL   RDW 40.9 81.1 - 91.4 %   Platelets 245 150 - 400 K/uL  ABO/Rh     Status: None (Preliminary result)   Collection Time: 10/24/16 11:01 PM  Result Value Ref Range   ABO/RH(D) O POS   hCG, quantitative, pregnancy     Status: Abnormal   Collection Time: 10/24/16 11:01 PM  Result Value Ref Range   hCG, Beta Chain, Quant, S 930 (H) <5 mIU/mL    Comment:          GEST. AGE      CONC.  (mIU/mL)   <=1 WEEK        5 - 50     2 WEEKS       50 - 500     3 WEEKS       100 - 10,000     4 WEEKS     1,000 - 30,000     5 WEEKS     3,500 - 115,000   6-8 WEEKS     12,000 - 270,000    12 WEEKS     15,000 - 220,000        FEMALE AND NON-PREGNANT FEMALE:     LESS THAN 5 mIU/mL   Wet prep, genital     Status: Abnormal   Collection Time: 10/25/16  12:21 AM  Result Value Ref Range   Yeast Wet Prep HPF POC NONE SEEN NONE SEEN   Trich, Wet Prep NONE SEEN NONE SEEN   Clue Cells Wet Prep HPF POC NONE SEEN NONE SEEN   WBC, Wet Prep HPF POC FEW (A) NONE SEEN    Comment: MODERATE BACTERIA SEEN   Sperm NONE SEEN    US Ob Comp Less 14 Wks  Result Date: 10/24/2016 CLINICAL DATA:  Pelvic pain EXAM: OBSTETRIC <14 WK [place] AND TRANSVAGINAL OB US TECHNIQUE: Both transabdominal and transvaginal ultrasound examinations were performed for complete evaluation of the gestation as well as the maternal uterus, adnexal regions, and pelvic cul-de-sac. Transvaginal technique was performed to assess early pregnancy. COMPARISON:  None. FINDINGS: Intrauterine gestational sac: Not visible Yolk sac:  Not visible Embryo:  Not visible Cardiac Activity: Not visible Heart Rate:   bpm MSD:   mm    w     d CRL:    mm    w    d                  [place] EDC: Subchorionic hemorrhage:  None visualized. Maternal uterus/adnexae: Normal ovaries. Small volume free pelvic fluid. IMPRESSION: Study limited due to patient body habitus. No visible gestational sac. Normal adnexal regions with small volume free pelvic fluid. Careful follow-up recommended. Electronically Signed   By: Ellery Plunk M.D.   On: 10/24/2016 23:56   US Ob Transvaginal  Result Date: 10/24/2016 CLINICAL DATA:  Pelvic pain EXAM: OBSTETRIC <14 WK [place] AND TRANSVAGINAL OB US TECHNIQUE: Both transabdominal and transvaginal ultrasound examinations were performed for complete evaluation of the gestation as well as the maternal uterus, adnexal regions, and pelvic cul-de-sac. Transvaginal technique was performed to assess early pregnancy. COMPARISON:  None. FINDINGS: Intrauterine gestational sac: Not visible Yolk sac:  Not visible Embryo:  Not visible Cardiac Activity: Not visible Heart Rate:   bpm MSD:  mm    w     d CRL:    mm    w    d                  [place] EDC: Subchorionic hemorrhage:  None visualized. Maternal uterus/adnexae:  Normal ovaries. Small volume free pelvic fluid. IMPRESSION: Study limited due to patient body habitus. No visible gestational sac. Normal adnexal regions with small volume free pelvic fluid. Careful follow-up recommended. Electronically Signed   By: Ellery Plunk M.D.   On: 10/24/2016 23:56   Review of Systems  Constitutional: Positive for fatigue.  Gastrointestinal: Positive for abdominal pain (Mid-lower abdominal pain. ).  Genitourinary: Positive for vaginal bleeding.   Physical Exam   Blood pressure 135/75, pulse 106, temperature 98.3 F (36.8 C), temperature source Oral, resp. rate 18, height 5' 3.5" (1.613 m), weight (!) 353 lb (160.1 kg), last menstrual period 09/16/2016.  Physical Exam  Constitutional: She is oriented to person, place, and time. She appears well-developed and well-nourished. No distress.  HENT:  Head: Normocephalic.  Eyes: Pupils are equal, round, and reactive to light.  Respiratory: Effort normal.  GI: Soft. Normal appearance. There is tenderness in the suprapubic area. There is no rigidity, no rebound and no guarding.  Genitourinary:  Genitourinary Comments: Wet prep and GC collected without speculum   Musculoskeletal: Normal range of motion.  Neurological: She is alert and oriented to person, place, and time.  Skin: Skin is warm. She is not diaphoretic.  Psychiatric: Her behavior is normal.    MAU Course  Procedures  None  MDM CBC Hcg ABO HIV [place]  Assessment and Plan   A:  1. Pregnancy of unknown anatomic location   2. Abdominal pain in pregnancy, antepartum     P:  Discharge home in stable condition  Return to the [facility] on 2/6 @ 11:00 for stat bhcg level Return to MAU as needed, if symptoms worsen Strict return precautions Ectopic precautions Pelvic rest    Duane Lope, NP 10/25/2016 1:06 AM

## 2016-10-25 DIAGNOSIS — R109 Unspecified abdominal pain: Secondary | ICD-10-CM | POA: Diagnosis not present

## 2016-10-25 LAB — WET PREP, GENITAL
Clue Cells Wet Prep HPF POC: NONE SEEN
SPERM: NONE SEEN
Trich, Wet Prep: NONE SEEN
Yeast Wet Prep HPF POC: NONE SEEN

## 2016-10-25 LAB — HIV ANTIBODY (ROUTINE TESTING W REFLEX): HIV SCREEN 4TH GENERATION: NONREACTIVE

## 2016-10-25 LAB — ABO/RH: ABO/RH(D): O POS

## 2016-10-25 NOTE — Discharge Instructions (Signed)
Abdominal Pain During Pregnancy °Belly (abdominal) pain is common during pregnancy. Most of the time, it is not a serious problem. Other times, it can be a sign that something is wrong with the pregnancy. Always tell your doctor if you have belly pain. °Follow these instructions at home: °Monitor your belly pain for any changes. The following actions may help you feel better: °· Do not have sex (intercourse) or put anything in your vagina until you feel better. °· Rest until your pain stops. °· Drink clear fluids if you feel sick to your stomach (nauseous). Do not eat solid food until you feel better. °· Only take medicine as told by your doctor. °· Keep all doctor visits as told. °Get help right away if: °· You are bleeding, leaking fluid, or pieces of tissue come out of your vagina. °· You have more pain or cramping. °· You keep throwing up (vomiting). °· You have pain when you pee (urinate) or have blood in your pee. °· You have a fever. °· You do not feel your baby moving as much. °· You feel very weak or feel like passing out. °· You have trouble breathing, with or without belly pain. °· You have a very bad headache and belly pain. °· You have fluid leaking from your vagina and belly pain. °· You keep having watery poop (diarrhea). °· Your belly pain does not go away after resting, or the pain gets worse. °This information is not intended to replace advice given to you by your health care provider. Make sure you discuss any questions you have with your health care provider. °Document Released: 08/26/2009 Document Revised: 04/15/2016 Document Reviewed: 04/06/2013 °Elsevier Interactive Patient Education © 2017 [employer] ° °

## 2016-10-26 LAB — GC/CHLAMYDIA PROBE AMP (~~LOC~~) NOT AT ARMC
Chlamydia: NEGATIVE
NEISSERIA GONORRHEA: NEGATIVE

## 2016-10-27 ENCOUNTER — Ambulatory Visit: Payer: BLUE CROSS/BLUE SHIELD

## 2016-10-27 ENCOUNTER — Telehealth: Payer: Self-pay

## 2016-10-27 NOTE — Telephone Encounter (Signed)
Patient was down for a stat today and miss her appointment. Called patient  no answer left a message we are trying to reach her regarding her miiss stat beta appointment.

## 2016-11-28 ENCOUNTER — Emergency Department (HOSPITAL_BASED_OUTPATIENT_CLINIC_OR_DEPARTMENT_OTHER)
Admission: EM | Admit: 2016-11-28 | Discharge: 2016-11-28 | Disposition: A | Discharge status: Home / Self Care | Payer: BLUE CROSS/BLUE SHIELD | Attending: Emergency Medicine | Admitting: Emergency Medicine

## 2016-11-28 ENCOUNTER — Encounter (HOSPITAL_BASED_OUTPATIENT_CLINIC_OR_DEPARTMENT_OTHER): Payer: Self-pay | Admitting: Emergency Medicine

## 2016-11-28 DIAGNOSIS — J45909 Unspecified asthma, uncomplicated: Secondary | ICD-10-CM | POA: Insufficient documentation

## 2016-11-28 DIAGNOSIS — R21 Rash and other nonspecific skin eruption: Secondary | ICD-10-CM | POA: Diagnosis not present

## 2016-11-28 DIAGNOSIS — Z3A11 11 weeks gestation of pregnancy: Secondary | ICD-10-CM | POA: Insufficient documentation

## 2016-11-28 DIAGNOSIS — Z91010 Allergy to peanuts: Secondary | ICD-10-CM | POA: Diagnosis not present

## 2016-11-28 DIAGNOSIS — M545 Low back pain, unspecified: Secondary | ICD-10-CM

## 2016-11-28 DIAGNOSIS — Z87891 Personal history of nicotine dependence: Secondary | ICD-10-CM | POA: Insufficient documentation

## 2016-11-28 DIAGNOSIS — O26891 Other specified pregnancy related conditions, first trimester: Secondary | ICD-10-CM | POA: Insufficient documentation

## 2016-11-28 LAB — URINALYSIS, ROUTINE W REFLEX MICROSCOPIC
BILIRUBIN URINE: NEGATIVE
Glucose, UA: NEGATIVE mg/dL
Hgb urine dipstick: NEGATIVE
Ketones, ur: NEGATIVE mg/dL
LEUKOCYTES UA: NEGATIVE
NITRITE: NEGATIVE
Protein, ur: NEGATIVE mg/dL
SPECIFIC GRAVITY, URINE: 1.023 (ref 1.005–1.030)
pH: 7 (ref 5.0–8.0)

## 2016-11-28 NOTE — ED Triage Notes (Signed)
Pt states she is around 11 weeks and is having back pain

## 2016-11-28 NOTE — ED Provider Notes (Signed)
[facility] Provider Note   CSN: [id number] Arrival date & time: 11/28/16  1713  By signing my name below, I, Alyssa GroveMartin Green, attest that this documentation has been prepared under the direction and in the presence of Newell RubbermaidJeffrey Branon Sabine, PA-C. Electronically Signed: Alyssa GroveMartin Green, ED Scribe. 11/28/16. 6:37 PM.  History   Chief Complaint Chief Complaint  Patient presents with  . Back Pain   The history is provided by the patient. No language interpreter was used.   HPI Comments: Latasha Decker is a 21 y.o. female who presents to the Emergency Department complaining of gradual onset and worsening, constant, moderate right lower back pain for 3 days. She is currently pregnant and notes LNMP was on 09/17/2016. She denies recent heavy lifting, injury or trauma. No treatments tried. No abdominal pain, numbness, tingling, weakness, vaginal bleeding, fever, nausea, vomiting, diarrhea, or any other symptoms. Worse with movement.  No urinary complaints.  Past Medical History:  Diagnosis Date  . Asthma   . Headache    occasional severe headache   . Morbid obesity (HCC)   . Pneumonia    hx of in 2012     Patient Active Problem List   Diagnosis Date Noted  . Abdominal pain, right upper quadrant 06/28/2016  . Cholelithiasis 06/28/2016    Past Surgical History:  Procedure Laterality Date  . CHOLECYSTECTOMY N/A 06/29/2016   Procedure: LAPAROSCOPIC CHOLECYSTECTOMY;  Surgeon: Darnell Levelodd Gerkin, MD;  Location: [facility];  Service: General;  Laterality: N/A;  . MOUTH SURGERY    . ORIF TIBIA FRACTURE      OB History    Gravida Para Term Preterm AB Living   1             SAB TAB Ectopic Multiple Live Births                   Home Medications    Prior to Admission medications   Medication Sig Start Date End Date Taking? Authorizing Provider  Prenatal MV-Min-Fe Fum-FA-DHA (PRENATAL 1 PO) Take by mouth.   Yes Historical Provider, MD    Family History Family History  Problem Relation  Age of Onset  . Other Neg Hx     Social History Social History  Substance Use Topics  . Smoking status: Former Smoker    Packs/day: 0.25    Years: 4.00  . Smokeless tobacco: Never Used  . Alcohol use Yes     Comment: occasionally     Allergies   Peanuts [nuts]   Review of Systems Review of Systems  Constitutional: Negative for fever.  Gastrointestinal: Negative for abdominal pain, diarrhea, nausea and vomiting.  Genitourinary: Negative for vaginal bleeding.  Musculoskeletal: Positive for back pain.  Skin: Positive for rash.  Neurological: Negative for weakness and numbness.   Physical Exam Updated Vital Signs BP 144/83   Pulse 84   Temp 98.6 F (37 C) (Oral)   Resp 18   Ht 5\' 4"  (1.626 m)   Wt (!) 160.1 kg   LMP 09/16/2016   SpO2 100%   BMI 60.59 kg/m   Physical Exam  Constitutional: She is oriented to person, place, and time. She appears well-developed and well-nourished. She is active. No distress.  HENT:  Head: Normocephalic and atraumatic.  Eyes: Conjunctivae are normal.  Cardiovascular: Normal rate.   Pulmonary/Chest: Effort normal. No respiratory distress.  Abdominal: Soft. There is no tenderness.  No CVA tenderness  Musculoskeletal: Normal range of motion.  Tenderness to palpation of the right  lower lumbar soft tissue No tenderness to C, T or L-spine  Neurological: She is alert and oriented to person, place, and time.  Skin: Skin is warm and dry.  Psychiatric: She has a normal mood and affect. Her behavior is normal.  Nursing note and vitals reviewed.  ED Treatments / Results  DIAGNOSTIC STUDIES: Oxygen Saturation is 100% on RA, normal by my interpretation.    COORDINATION OF CARE: 6:32 PM Discussed treatment plan with pt at bedside which includes urinalysis and pt agreed to plan.  Labs (all labs ordered are listed, but only abnormal results are displayed) Labs Reviewed  URINALYSIS, ROUTINE W REFLEX MICROSCOPIC - Abnormal; Notable for the  following:       Result Value   APPearance CLOUDY (*)    All other components within normal limits    EKG  EKG Interpretation None       Radiology No results found.  Procedures Procedures (including critical care time)  Medications Ordered in ED Medications - No data to display   Initial Impression / Assessment and Plan / ED Course  I have reviewed the triage vital signs and the nursing notes.  Pertinent labs & imaging results that were available during my care of the patient were reviewed by me and considered in my medical decision making (see chart for details).     I personally performed the services described in this documentation, which was scribed in my presence. The recorded information has been reviewed and is accurate.  Final Clinical Impressions(s) / ED Diagnoses   Final diagnoses:  Acute right-sided low back pain without sciatica    21 year old female presents today with complaints of back pain.  This is muscular in nature, tender to palpation.  She has no abdominal pain, urinary complaints, negative urinalysis.  Patient instructed to use Tylenol as needed for discomfort, follow up with her primary care for reassessment if symptoms continue to persist.  She is given strict return precautions, she verbalized understanding and agreement to today's plan had no further questions or concerns at time of discharge.  New Prescriptions New Prescriptions   No medications on file     Eyvonne Mechanic, PA-C 11/28/16 1916    Rolan Bucco, MD 11/28/16 2108

## 2016-11-28 NOTE — Discharge Instructions (Signed)
Please read attached information. If you experience any new or worsening signs or symptoms please return to the emergency room for evaluation. Please follow-up with your primary care provider or specialist as discussed.  °

## 2016-11-28 NOTE — ED Notes (Signed)
Pt discharged to home NAD.  

## 2016-12-23 ENCOUNTER — Encounter: Payer: Self-pay | Admitting: General Practice

## 2016-12-23 ENCOUNTER — Encounter: Payer: BLUE CROSS/BLUE SHIELD | Admitting: Obstetrics and Gynecology

## 2017-03-13 IMAGING — CR DG CHEST 2V
2 series · 2 of 2 positions shown · non-contrast
Comparison: 08/09/2014

CLINICAL DATA: Cough, congestion, and fever for 3 weeks

EXAM:
CHEST  2 VIEW

[w chest pa]
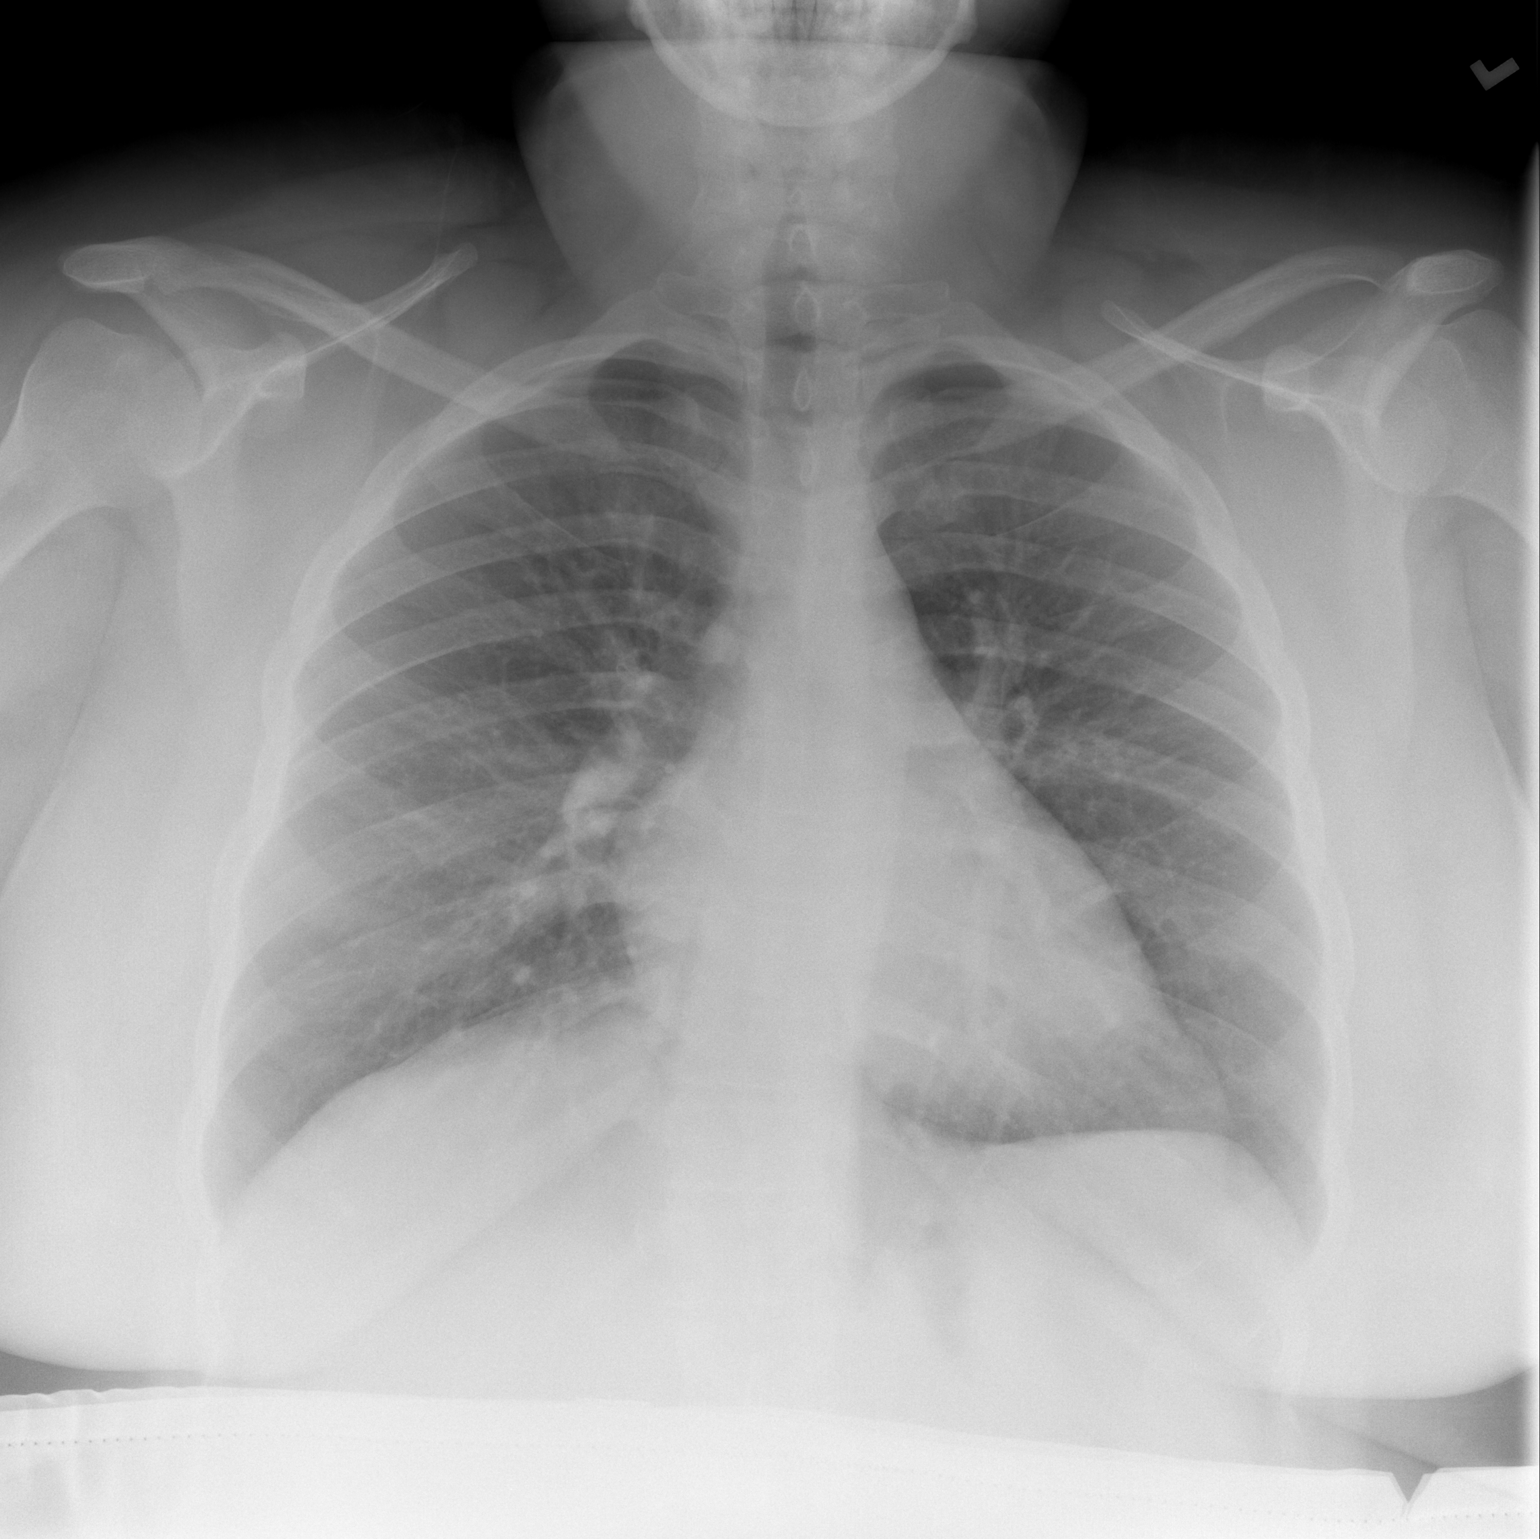

[w chest lat]
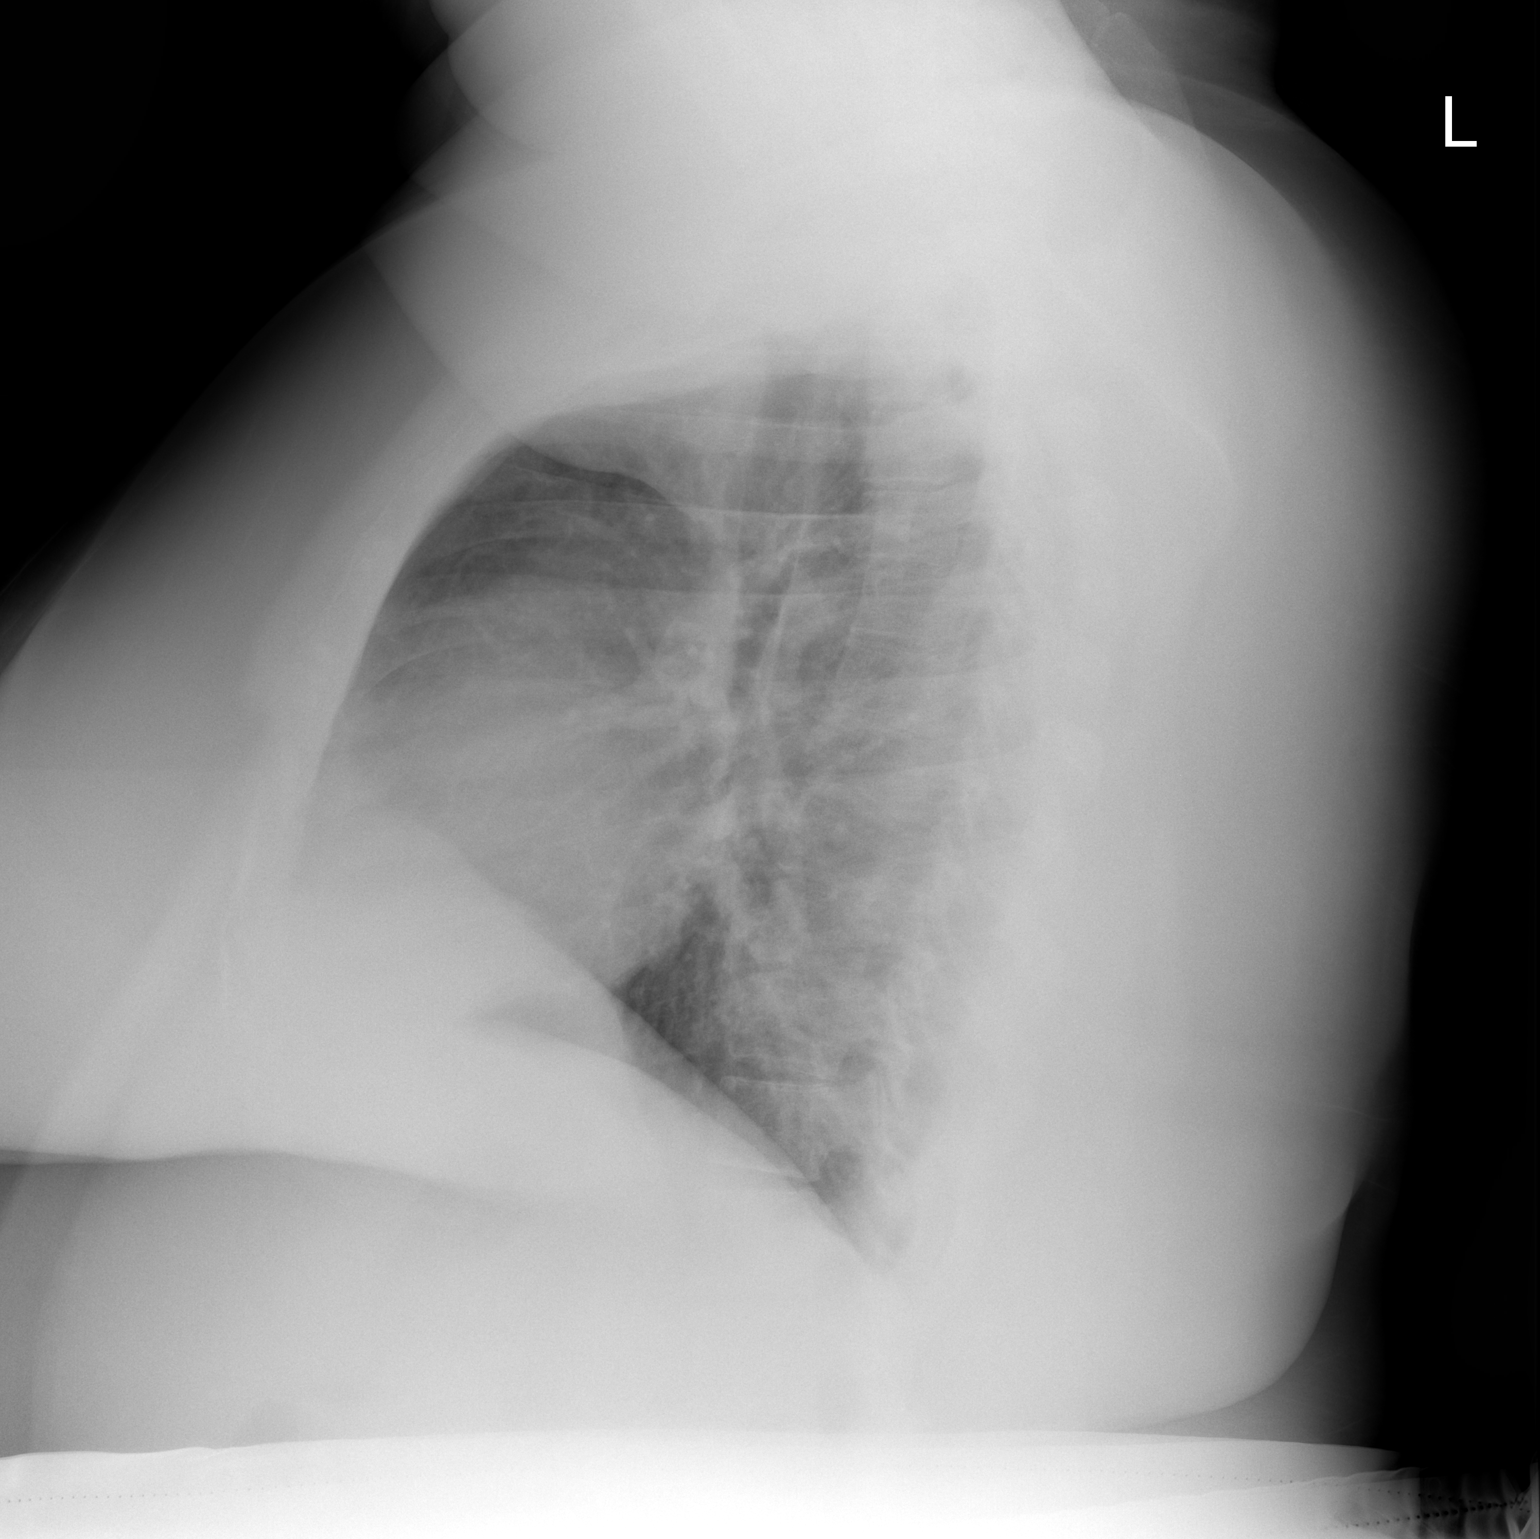

[2 of 2 positions shown; findings below may reference images not displayed]

FINDINGS: Normal heart size. Lungs clear. No pneumothorax. No pleural
effusion.
IMPRESSION: No active cardiopulmonary disease.

## 2017-07-29 IMAGING — US US ABDOMEN LIMITED
1 series · 14 of 25 positions shown · non-contrast
Comparison: Prior CT abdomen/pelvis 08/09/2014

CLINICAL DATA: 20-year-old female with a three-day history of right
upper quadrant pain.

EXAM:
US ABDOMEN LIMITED - RIGHT UPPER QUADRANT

[Series 1: us abdomen limited · 0.22mm/px · 14 of 54 slices shown]
[im 1/54]
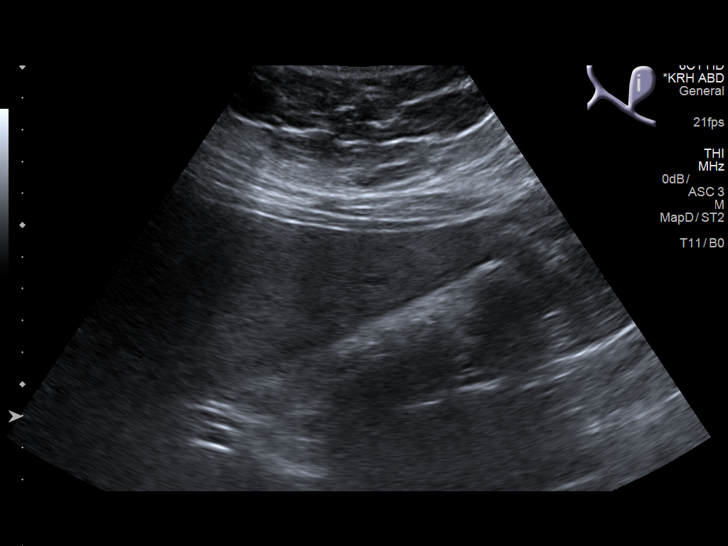
[im 5/54]
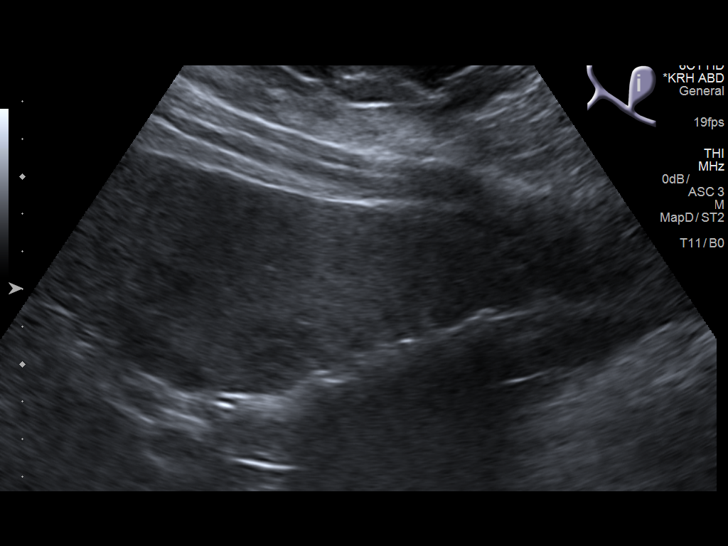
[im 9/54]
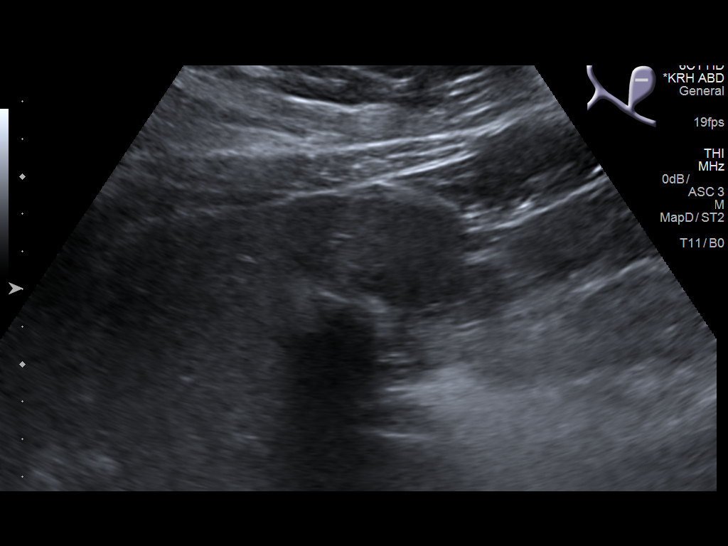
[im 14/54]
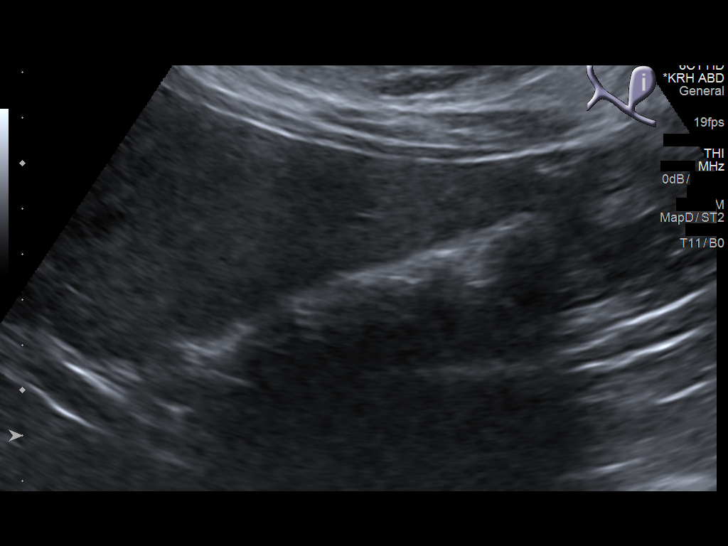
[im 18/54]
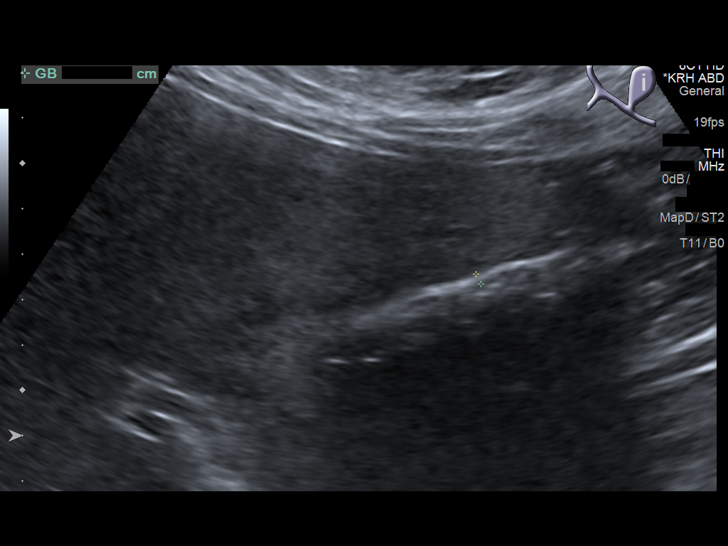
[im 20/54]
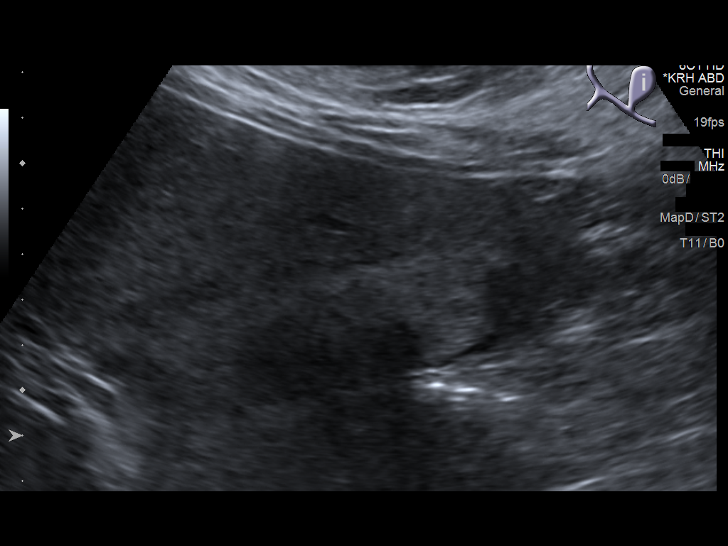
[im 25/54]
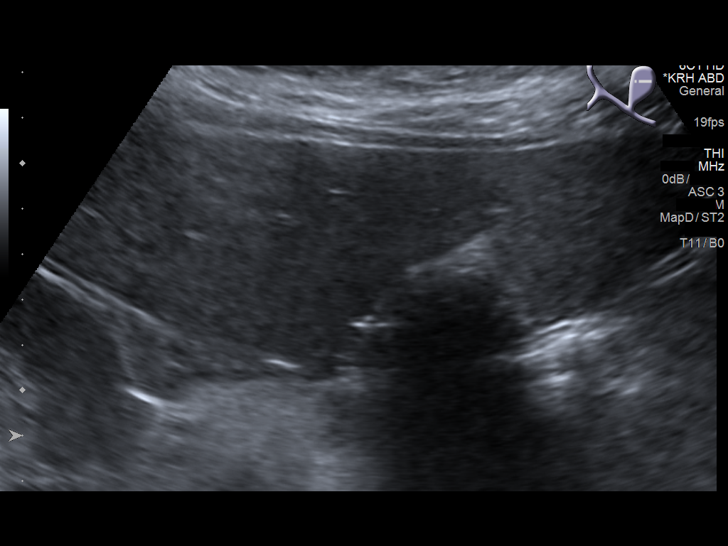
[im 29/54]
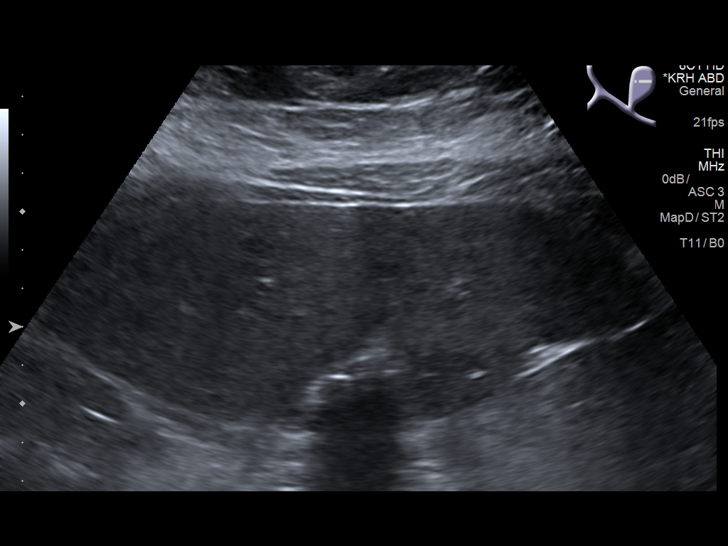
[im 34/54]
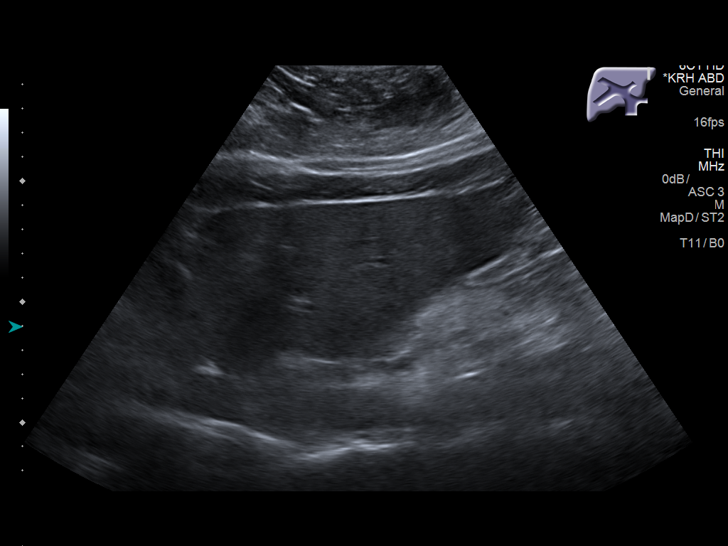
[im 36/54]
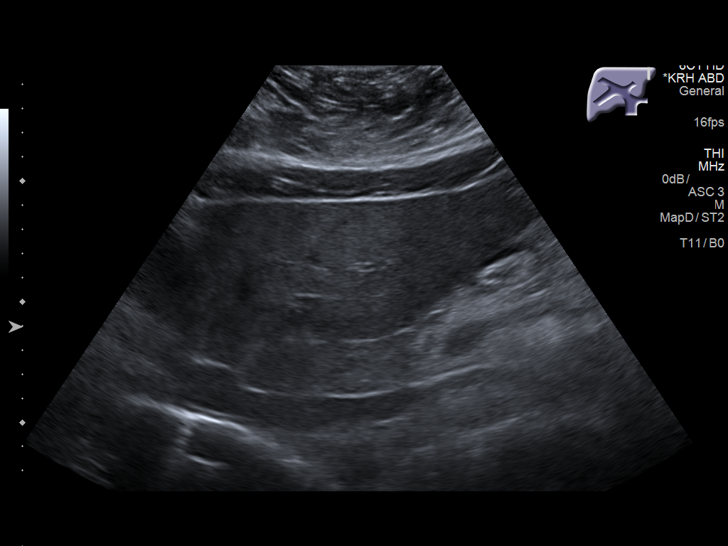
[im 40/54]
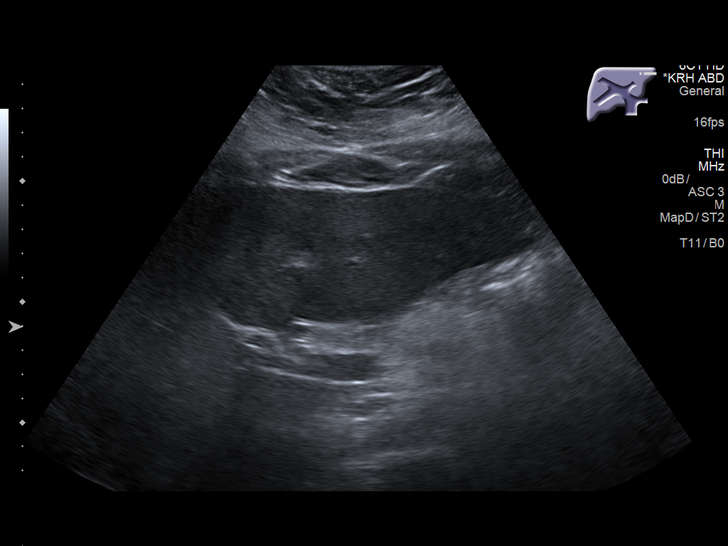
[im 45/54]
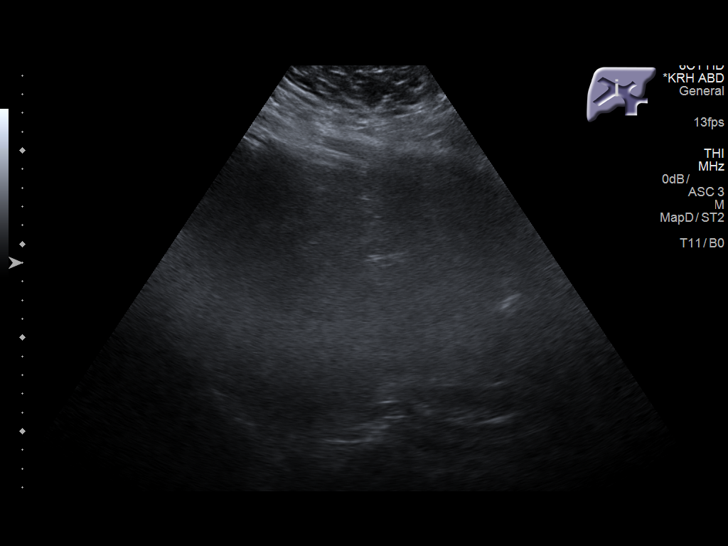
[im 49/54]
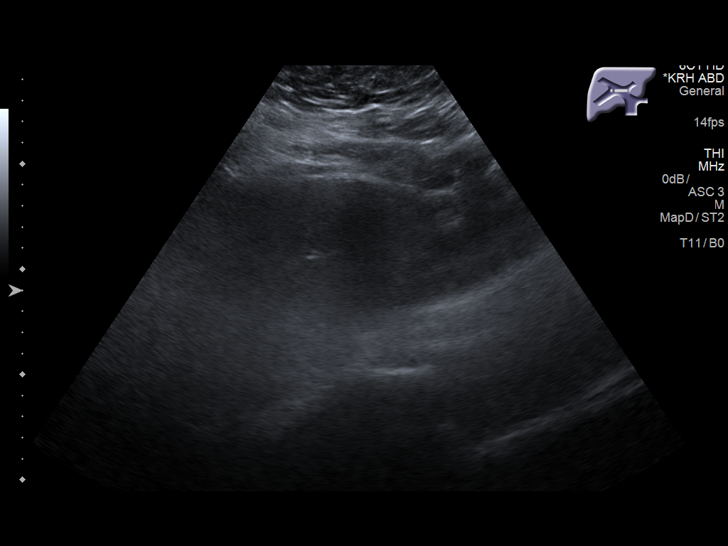
[im 54/54]
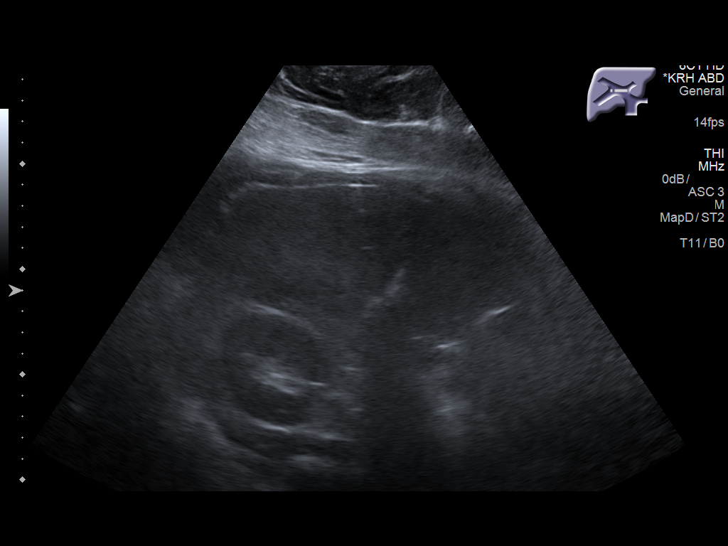

[14 of 25 positions shown; findings below may reference images not displayed]

FINDINGS: Gallbladder:

The gallbladder lumen is completely obscured by echogenic shadowing
material most consistent with numerous gallstones. The Per the
sonographer, the sonographic Murphy sign is negative. No evidence of
pericholecystic fluid or gallbladder wall thickening.

Common bile duct:

Diameter: Within normal limits at 2 mm.

Liver:

No focal lesion identified. Within normal limits in parenchymal
echogenicity.
IMPRESSION: The gallbladder lumen is packed with echogenic shadowing gallstones.
The extensive shadowing slightly limits evaluation for gallbladder
wall thickening and pericholecystic fluid. Per the sonographer, the
sonographic Murphy sign was negative.

## 2017-09-15 ENCOUNTER — Encounter (HOSPITAL_COMMUNITY): Payer: Self-pay

## 2018-01-26 IMAGING — US US OB COMP LESS 14 WK
1 series · 15 of 28 positions shown · non-contrast
Comparison: None.

CLINICAL DATA: Pelvic pain

EXAM:
OBSTETRIC <14 WK US AND TRANSVAGINAL OB US
TECHNIQUE: Both transabdominal and transvaginal ultrasound examinations were
performed for complete evaluation of the gestation as well as the
maternal uterus, adnexal regions, and pelvic cul-de-sac.
Transvaginal technique was performed to assess early pregnancy.

[Series 1: us ob comp less 14 wk · 15 of 43 slices shown]
[im 1/43]
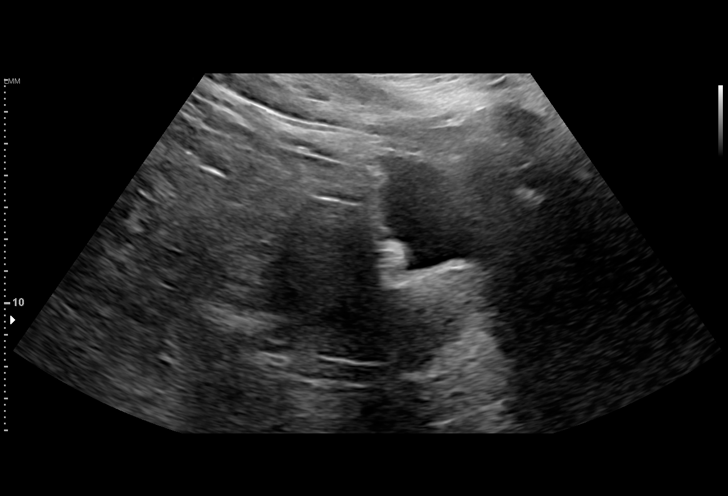
[im 4/43]
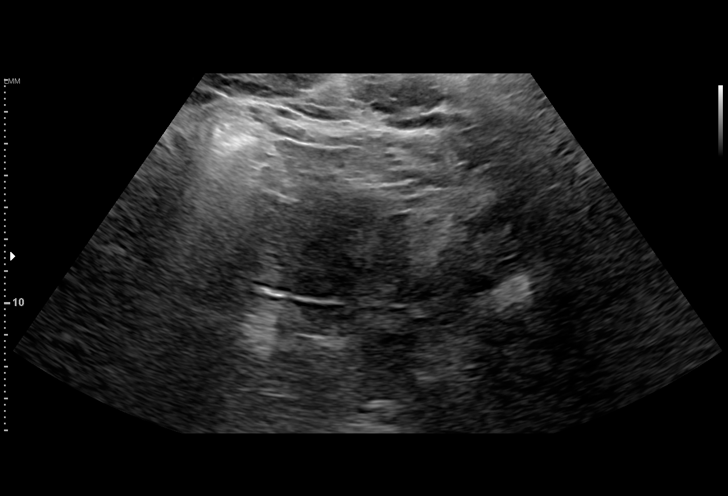
[im 7/43]
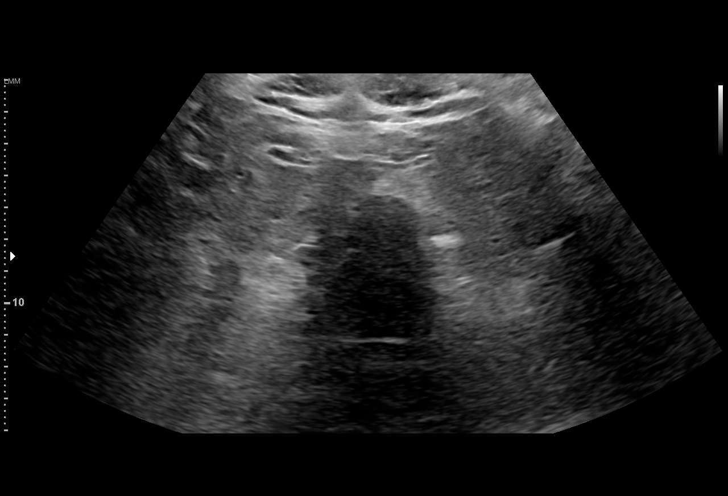
[im 10/43]
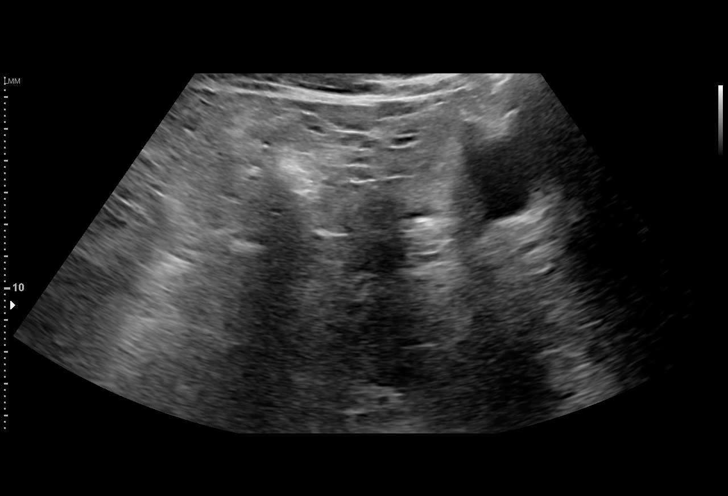
[im 13/43]
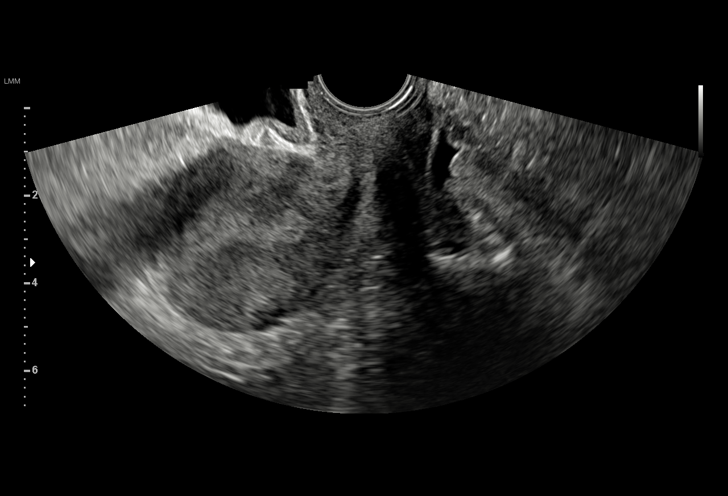
[im 16/43]
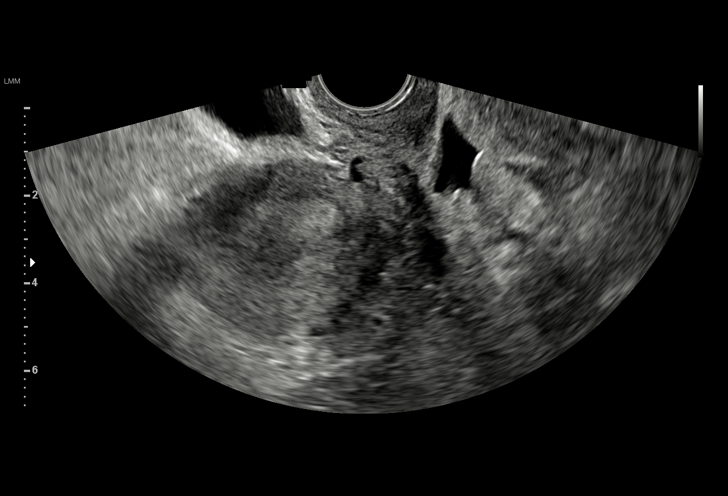
[im 19/43]
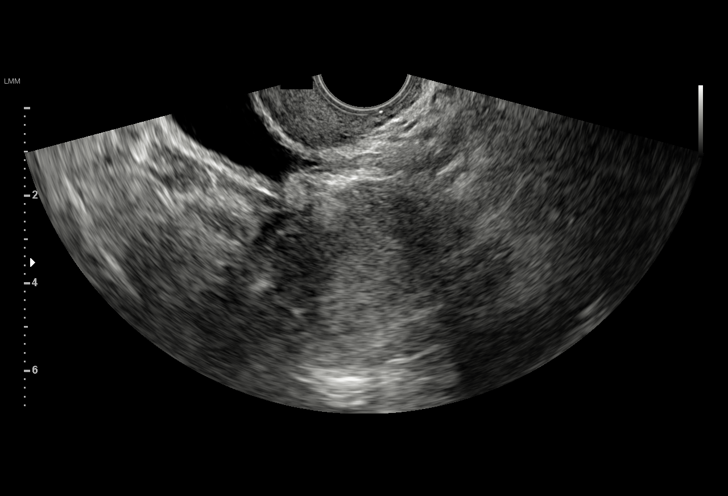
[im 22/43]
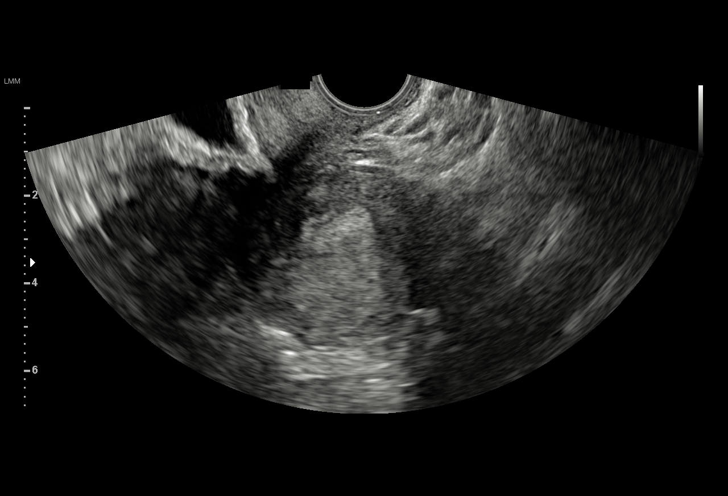
[im 24/43]
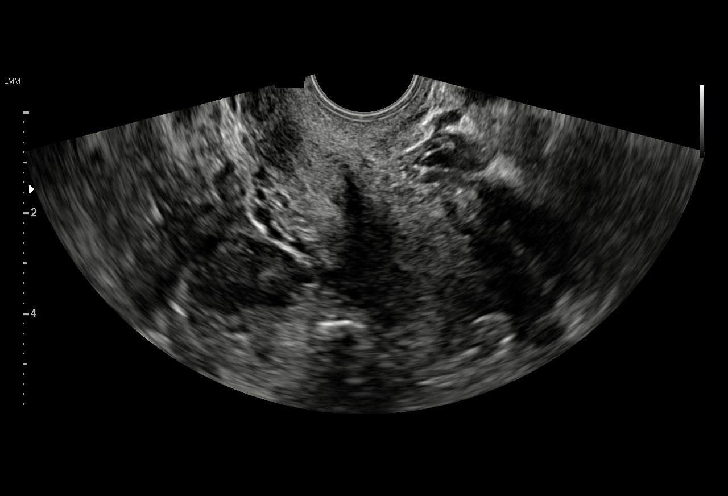
[im 27/43]
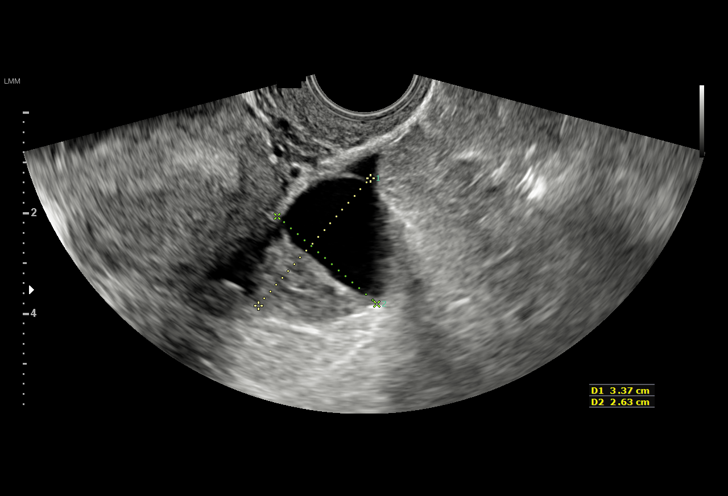
[im 30/43]
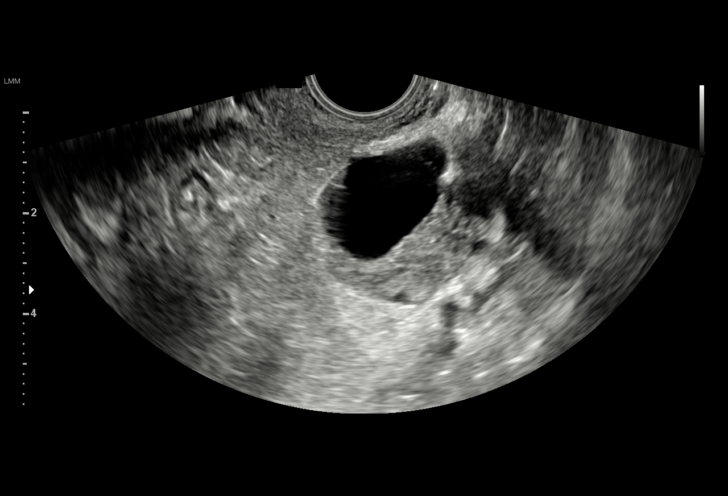
[im 33/43]
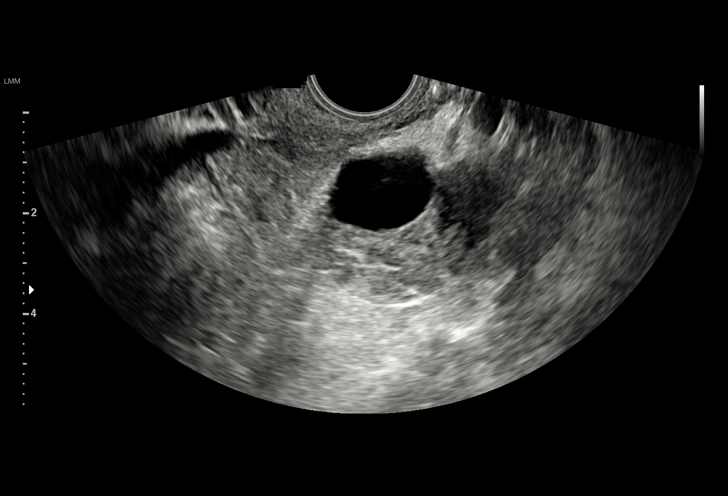
[im 36/43]
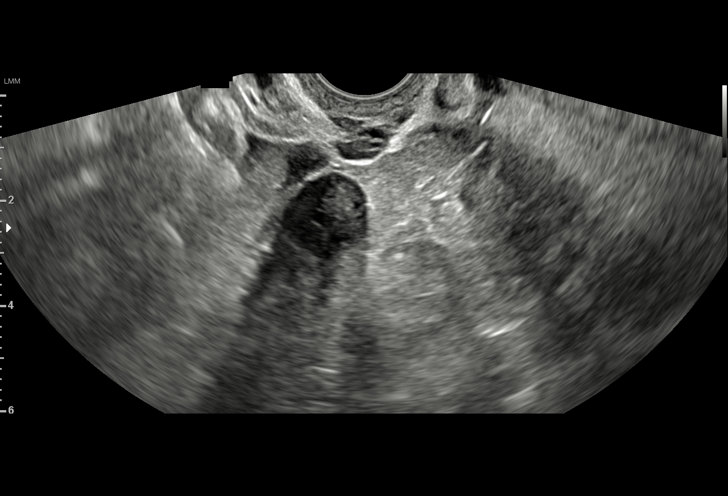
[im 39/43]
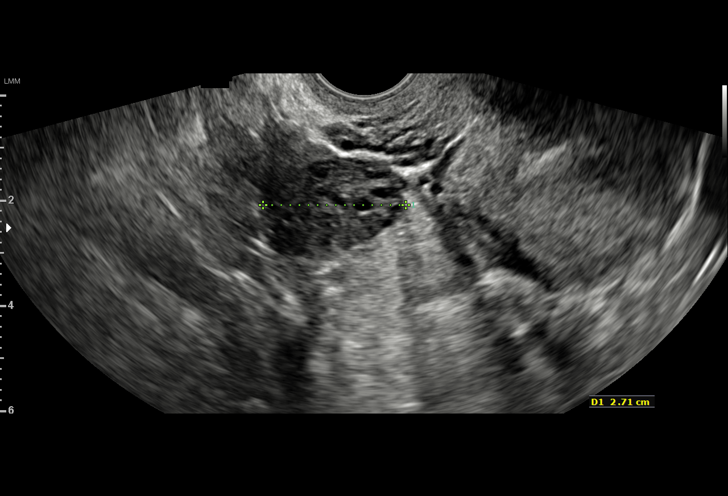
[im 43/43]
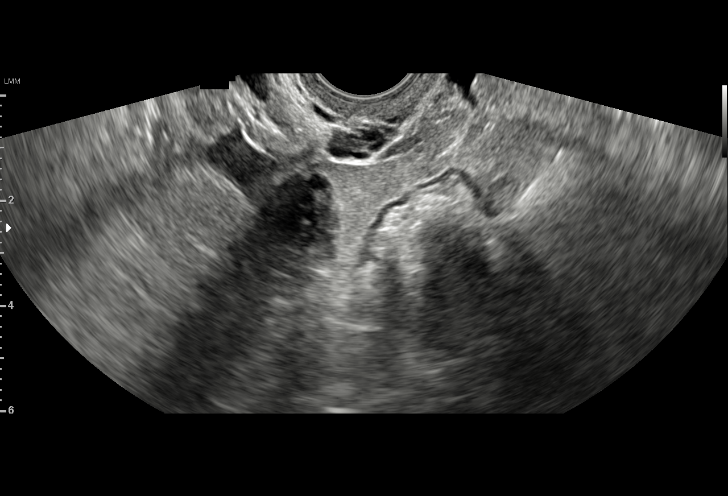

[15 of 28 positions shown; findings below may reference images not displayed]

FINDINGS: Intrauterine gestational sac: Not visible

Yolk sac:  Not visible

Embryo:  Not visible

Cardiac Activity: Not visible

Heart Rate:   bpm

MSD:   mm    w     d

CRL:    mm    w    d                  US EDC:

Subchorionic hemorrhage:  None visualized.

Maternal uterus/adnexae: Normal ovaries. Small volume free pelvic
fluid.
IMPRESSION: Study limited due to patient body habitus. No visible gestational
sac. Normal adnexal regions with small volume free pelvic fluid.
Careful follow-up recommended.

## 2018-05-03 ENCOUNTER — Encounter (HOSPITAL_BASED_OUTPATIENT_CLINIC_OR_DEPARTMENT_OTHER): Payer: Self-pay | Admitting: *Deleted

## 2018-05-03 ENCOUNTER — Emergency Department (HOSPITAL_BASED_OUTPATIENT_CLINIC_OR_DEPARTMENT_OTHER)
Admission: EM | Admit: 2018-05-03 | Discharge: 2018-05-03 | Disposition: A | Discharge status: Home / Self Care | Payer: BLUE CROSS/BLUE SHIELD | Attending: Emergency Medicine | Admitting: Emergency Medicine

## 2018-05-03 ENCOUNTER — Other Ambulatory Visit: Payer: Self-pay

## 2018-05-03 DIAGNOSIS — Z87891 Personal history of nicotine dependence: Secondary | ICD-10-CM | POA: Insufficient documentation

## 2018-05-03 DIAGNOSIS — J029 Acute pharyngitis, unspecified: Secondary | ICD-10-CM

## 2018-05-03 DIAGNOSIS — N3000 Acute cystitis without hematuria: Secondary | ICD-10-CM | POA: Insufficient documentation

## 2018-05-03 DIAGNOSIS — J45909 Unspecified asthma, uncomplicated: Secondary | ICD-10-CM | POA: Insufficient documentation

## 2018-05-03 DIAGNOSIS — R509 Fever, unspecified: Secondary | ICD-10-CM | POA: Insufficient documentation

## 2018-05-03 LAB — URINALYSIS, ROUTINE W REFLEX MICROSCOPIC
Bilirubin Urine: NEGATIVE
GLUCOSE, UA: NEGATIVE mg/dL
KETONES UR: NEGATIVE mg/dL
Nitrite: NEGATIVE
PROTEIN: 30 mg/dL — AB
Specific Gravity, Urine: 1.01 (ref 1.005–1.030)
pH: 6.5 (ref 5.0–8.0)

## 2018-05-03 LAB — GROUP A STREP BY PCR: Group A Strep by PCR: NOT DETECTED

## 2018-05-03 LAB — URINALYSIS, MICROSCOPIC (REFLEX): RBC / HPF: >50 RBC/hpf (ref 0–5)

## 2018-05-03 LAB — PREGNANCY, URINE: PREG TEST UR: NEGATIVE

## 2018-05-03 MED ORDER — CEPHALEXIN 500 MG PO CAPS: 500.0000 mg | ORAL_CAPSULE | Freq: Three times a day (TID) | ORAL | 0 refills | Status: AC

## 2018-05-03 MED ORDER — DEXAMETHASONE SODIUM PHOSPHATE 10 MG/ML IJ SOLN
10.0000 mg | Freq: Once | INTRAMUSCULAR | Status: DC
  Filled 2018-05-03: qty 1

## 2018-05-03 MED ORDER — IBUPROFEN 800 MG PO TABS: 800.0000 mg | ORAL_TABLET | Freq: Three times a day (TID) | ORAL | 0 refills | Status: AC | PRN

## 2018-05-03 MED ORDER — IBUPROFEN 400 MG PO TABS
400.0000 mg | ORAL_TABLET | Freq: Once | ORAL | Status: AC
  Administered 2018-05-03: 400 mg via ORAL
  Filled 2018-05-03: qty 1

## 2018-05-03 MED ORDER — DEXAMETHASONE SODIUM PHOSPHATE 10 MG/ML IJ SOLN
10.0000 mg | Freq: Once | INTRAMUSCULAR | Status: AC
  Administered 2018-05-03: 10 mg via INTRAMUSCULAR

## 2018-05-03 MED FILL — IBUPROFEN 800 MG TAB: 800 | 7 days supply | Qty: 21 | Fill #0

## 2018-05-03 MED FILL — CEPHALEXIN 500 MG CAPSULE: 500 | 7 days supply | Qty: 21 | Fill #0

## 2018-05-03 NOTE — Discharge Instructions (Signed)

## 2018-05-03 NOTE — ED Triage Notes (Signed)
Fever, sore throat and possible UTI. Symptoms x 2 days.

## 2018-05-03 NOTE — ED Provider Notes (Signed)
Emergency Department Provider Note   I have reviewed the triage vital signs and the nursing notes.   HISTORY  Chief Complaint Fever   HPI Latasha Decker is a 22 y.o. female presents to the ED with UTI symptoms along with sore throat and fever for the last 2 days. She notes some mild dysuria without hesitancy or urgency. No vaginal bleeding, discharge, or pain. No vomiting or diarrhea. He throat has become sore as well. No difficulty breathing but does have painful swallowing. No sick contacts. No abdominal pain.    Past Medical History:  Diagnosis Date  . Asthma   . Headache    occasional severe headache   . Morbid obesity (HCC)   . Pneumonia    hx of in 2012     Patient Active Problem List   Diagnosis Date Noted  . Abdominal pain, right upper quadrant 06/28/2016  . Cholelithiasis 06/28/2016    Past Surgical History:  Procedure Laterality Date  . CHOLECYSTECTOMY N/A 06/29/2016   Procedure: LAPAROSCOPIC CHOLECYSTECTOMY;  Surgeon: Darnell Levelodd Gerkin, MD;  Location: [facility];  Service: General;  Laterality: N/A;  . MOUTH SURGERY    . ORIF TIBIA FRACTURE      Allergies Peanuts [nuts]  Family History  Problem Relation Age of Onset  . Other Neg Hx     Social History Social History   Tobacco Use  . Smoking status: Former Smoker    Packs/day: 0.25    Years: 4.00    Pack years: 1.00  . Smokeless tobacco: Never Used  Substance Use Topics  . Alcohol use: Yes    Comment: occasionally  . Drug use: No    Review of Systems  Constitutional: Positive fever/chills Eyes: No visual changes. ENT: Positive sore throat. Cardiovascular: Denies chest pain. Respiratory: Denies shortness of breath. Gastrointestinal: No abdominal pain.  No nausea, no vomiting.  No diarrhea.  No constipation. Genitourinary: Positive for dysuria. Musculoskeletal: Negative for back pain. Skin: Negative for rash. Neurological: Negative for headaches, focal weakness or numbness.  10-point ROS  otherwise negative.  ____________________________________________   PHYSICAL EXAM:  VITAL SIGNS: ED Triage Vitals  Enc Vitals Group     BP 05/03/18 1242 (!) 145/97     Pulse Rate 05/03/18 1242 (!) 116     Resp 05/03/18 1242 20     Temp 05/03/18 1242 (!) 101.4 F (38.6 C)     Temp Source 05/03/18 1242 Oral     SpO2 05/03/18 1242 98 %     Weight 05/03/18 1242 (!) 380 lb (172.4 kg)     Height 05/03/18 1242 5\' 4"  (1.626 m)     Pain Score 05/03/18 1256 10   Constitutional: Alert and oriented. Well appearing and in no acute distress. Eyes: Conjunctivae are normal.  Head: Atraumatic. Nose: No congestion/rhinnorhea. Mouth/Throat: Mucous membranes are moist.  Oropharynx with erythema but no exudate. No trismus or PTA. There is tonsillar hypertrophy, worse on the the right.  Neck: No stridor.  No meningeal signs.  Cardiovascular: Tachycardia. Good peripheral circulation. Grossly normal heart sounds.   Respiratory: Normal respiratory effort.  No retractions. Lungs CTAB. Gastrointestinal: Soft and nontender. No distention.  Musculoskeletal: No lower extremity tenderness nor edema. No gross deformities of extremities. Neurologic:  Normal speech and language. No gross focal neurologic deficits are appreciated.  Skin:  Skin is warm, dry and intact. No rash noted.  ____________________________________________   LABS (all labs ordered are listed, but only abnormal results are displayed)  Labs Reviewed  URINALYSIS,  ROUTINE W REFLEX MICROSCOPIC - Abnormal; Notable for the following components:      Result Value   APPearance CLOUDY (*)    Hgb urine dipstick LARGE (*)    Protein, ur 30 (*)    Leukocytes, UA TRACE (*)    All other components within normal limits  URINALYSIS, MICROSCOPIC (REFLEX) - Abnormal; Notable for the following components:   Bacteria, UA MANY (*)    All other components within normal limits  GROUP A STREP BY PCR  PREGNANCY, URINE    ____________________________________________  RADIOLOGY  None ____________________________________________   PROCEDURES  Procedure(s) performed:   Procedures  None ____________________________________________   INITIAL IMPRESSION / ASSESSMENT AND PLAN / ED COURSE  Pertinent labs & imaging results that were available during my care of the patient were reviewed by me and considered in my medical decision making (see chart for details).  Patient presents to the ED with UTI symptoms and sore throat. No URI symptoms. Normal chest exam. No hypoxemia. Defer CXR at this time. Patient is well-appearing. No acute distress. UA with some possible early UTI symptoms. With dysuria will treat with abx. Negative strep. No PTA. Plan for continued supportive care at home and close PCP follow up.   At this time, I do not feel there is any life-threatening condition present. I have reviewed and discussed all results (EKG, imaging, lab, urine as appropriate), exam findings with patient. I have reviewed nursing notes and appropriate previous records.  I feel the patient is safe to be discharged home without further emergent workup. Discussed usual and customary return precautions. Patient and family (if present) verbalize understanding and are comfortable with this plan.  Patient will follow-up with their primary care provider. If they do not have a primary care provider, information for follow-up has been provided to them. All questions have been answered.   ____________________________________________  FINAL CLINICAL IMPRESSION(S) / ED DIAGNOSES  Final diagnoses:  Acute cystitis without hematuria  Fever, unspecified fever cause  Pharyngitis, unspecified etiology     MEDICATIONS GIVEN DURING THIS VISIT:  Medications  ibuprofen (ADVIL,MOTRIN) tablet 400 mg (400 mg Oral Given 05/03/18 1304)  dexamethasone (DECADRON) injection 10 mg (10 mg Intramuscular Given 05/03/18 1407)     NEW  OUTPATIENT MEDICATIONS STARTED DURING THIS VISIT:  Discharge Medication List as of 05/03/2018  1:56 PM    START taking these medications   Details  cephALEXin (KEFLEX) 500 MG capsule Take 1 capsule (500 mg total) by mouth 3 (three) times daily for 7 days., Starting Tue 05/03/2018, Until Tue 05/10/2018, Print    ibuprofen (ADVIL,MOTRIN) 800 MG tablet Take 1 tablet (800 mg total) by mouth every 8 (eight) hours as needed., Starting Tue 05/03/2018, Print        Note:  This document was prepared using Dragon voice recognition software and may include unintentional dictation errors.  Alona BeneJoshua Long, MD Emergency Medicine    Long, Arlyss RepressJoshua G, MD 05/03/18 [phone]

## 2018-05-03 NOTE — ED Notes (Signed)
No adverse reaction to medications given.  Pt ambulating well, directed to discharge window and pharmacy.

## 2018-09-18 ENCOUNTER — Other Ambulatory Visit: Payer: Self-pay

## 2018-09-18 ENCOUNTER — Encounter (HOSPITAL_BASED_OUTPATIENT_CLINIC_OR_DEPARTMENT_OTHER): Payer: Self-pay | Admitting: Emergency Medicine

## 2018-09-18 ENCOUNTER — Emergency Department (HOSPITAL_BASED_OUTPATIENT_CLINIC_OR_DEPARTMENT_OTHER)
Admission: EM | Admit: 2018-09-18 | Discharge: 2018-09-18 | Disposition: A | Discharge status: Home / Self Care | Payer: Self-pay | Attending: Emergency Medicine | Admitting: Emergency Medicine

## 2018-09-18 DIAGNOSIS — Z87891 Personal history of nicotine dependence: Secondary | ICD-10-CM | POA: Insufficient documentation

## 2018-09-18 DIAGNOSIS — J020 Streptococcal pharyngitis: Secondary | ICD-10-CM | POA: Insufficient documentation

## 2018-09-18 DIAGNOSIS — Z91010 Allergy to peanuts: Secondary | ICD-10-CM | POA: Insufficient documentation

## 2018-09-18 DIAGNOSIS — J45909 Unspecified asthma, uncomplicated: Secondary | ICD-10-CM | POA: Insufficient documentation

## 2018-09-18 DIAGNOSIS — Z79899 Other long term (current) drug therapy: Secondary | ICD-10-CM | POA: Insufficient documentation

## 2018-09-18 LAB — GROUP A STREP BY PCR: Group A Strep by PCR: DETECTED — AB

## 2018-09-18 MED ORDER — PREDNISONE 50 MG PO TABS
60.0000 mg | ORAL_TABLET | Freq: Once | ORAL | Status: AC
  Administered 2018-09-18: 60 mg via ORAL
  Filled 2018-09-18: qty 1

## 2018-09-18 MED ORDER — IBUPROFEN 400 MG PO TABS
600.0000 mg | ORAL_TABLET | Freq: Once | ORAL | Status: AC
  Administered 2018-09-18: 600 mg via ORAL
  Filled 2018-09-18: qty 1

## 2018-09-18 MED ORDER — AMOXICILLIN 500 MG PO CAPS: 500.0000 mg | ORAL_CAPSULE | Freq: Two times a day (BID) | ORAL | 0 refills | Status: AC

## 2018-09-18 NOTE — ED Provider Notes (Addendum)
[facility] EMERGENCY DEPARTMENT Provider Note   CSN: [id number] Arrival date & time: 09/18/18  1218     History   Chief Complaint Chief Complaint  Patient presents with  . Sore Throat    HPI Latasha Decker is a 22 y.o. female who presents today for evaluation of a sore throat for approximately 3 days.  She tells me that she has had no cough, however generally feels unwell.  She reports subjective fevers and chills.  She denies any chest pain or shortness of breath.  No nausea vomiting diarrhea.  She has not had any ibuprofen or Tylenol today.  She tells me that it hurts to swallow however she is still able to swallow.  She did not get a flu shot this year.  HPI  Past Medical History:  Diagnosis Date  . Asthma   . Headache    occasional severe headache   . Morbid obesity (HCC)   . Pneumonia    hx of in 2012     Patient Active Problem List   Diagnosis Date Noted  . Abdominal pain, right upper quadrant 06/28/2016  . Cholelithiasis 06/28/2016    Past Surgical History:  Procedure Laterality Date  . CHOLECYSTECTOMY N/A 06/29/2016   Procedure: LAPAROSCOPIC CHOLECYSTECTOMY;  Surgeon: Darnell Level, MD;  Location: [facility];  Service: General;  Laterality: N/A;  . MOUTH SURGERY    . ORIF TIBIA FRACTURE       OB History    Gravida  1   Para      Term      Preterm      AB      Living        SAB      TAB      Ectopic      Multiple      Live Births               Home Medications    Prior to Admission medications   Medication Sig Start Date End Date Taking? Authorizing Provider  amoxicillin (AMOXIL) 500 MG capsule Take 1 capsule (500 mg total) by mouth 2 (two) times daily for 10 days. 09/18/18 09/28/18  Cristina Gong, PA-C  ibuprofen (ADVIL,MOTRIN) 800 MG tablet Take 1 tablet (800 mg total) by mouth every 8 (eight) hours as needed. 05/03/18   Long, Arlyss Repress, MD  Prenatal MV-Min-Fe Fum-FA-DHA (PRENATAL 1 PO) Take by mouth.    [provider]    Family History Family History  Problem Relation Age of Onset  . Other Neg Hx     Social History Social History   Tobacco Use  . Smoking status: Former Smoker    Packs/day: 0.25    Years: 4.00    Pack years: 1.00  . Smokeless tobacco: Never Used  Substance Use Topics  . Alcohol use: Yes    Comment: occasionally  . Drug use: No     Allergies   Peanuts [nuts]   Review of Systems Review of Systems  Constitutional: Positive for chills and fever.  HENT: Positive for sore throat. Negative for congestion, dental problem, ear pain, rhinorrhea and trouble swallowing.   Respiratory: Negative for cough, shortness of breath and stridor.   Cardiovascular: Negative for chest pain.  Gastrointestinal: Negative for abdominal pain, diarrhea, nausea and vomiting.  Neurological: Negative for headaches.  All other systems reviewed and are negative.    Physical Exam Updated Vital Signs BP 128/85 (BP Location: Left Arm)   Pulse Marland Kitchen)  107   Temp 100.1 F (37.8 C)   Resp 20   Ht 5\' 4"  (1.626 m)   Wt (!) 176.9 kg   LMP 08/19/2018   SpO2 100%   BMI 66.94 kg/m   Physical Exam Vitals signs and nursing note reviewed.  Constitutional:      General: She is not in acute distress.    Appearance: She is well-developed. She is not ill-appearing or diaphoretic.  HENT:     Head: Normocephalic and atraumatic.     Right Ear: Tympanic membrane, ear canal and external ear normal. No drainage or swelling.     Left Ear: Tympanic membrane, ear canal and external ear normal. No drainage or swelling.     Nose: Mucosal edema and rhinorrhea present.     Mouth/Throat:     Mouth: Mucous membranes are moist.     Pharynx: Uvula midline. Oropharyngeal exudate and posterior oropharyngeal erythema present. No pharyngeal swelling or uvula swelling.     Tonsils: Tonsillar exudate present. No tonsillar abscesses. Swelling: 3+ on the right. 3+ on the left.  Eyes:     General: No scleral  icterus.    Conjunctiva/sclera: Conjunctivae normal.  Neck:     Musculoskeletal: Normal range of motion and neck supple.  Cardiovascular:     Rate and Rhythm: Normal rate and regular rhythm.     Heart sounds: Normal heart sounds. No murmur.  Pulmonary:     Effort: Pulmonary effort is normal. No respiratory distress.     Breath sounds: Normal breath sounds. No stridor. No wheezing.  Lymphadenopathy:     Cervical: Cervical adenopathy present.  Skin:    General: Skin is warm and dry.  Neurological:     Mental Status: She is alert.  Psychiatric:        Behavior: Behavior normal.      ED Treatments / Results  Labs (all labs ordered are listed, but only abnormal results are displayed) Labs Reviewed  GROUP A STREP BY PCR - Abnormal; Notable for the following components:      Result Value   Group A Strep by PCR DETECTED (*)    All other components within normal limits    EKG None  Radiology No results found.  Procedures Procedures (including critical care time)  Medications Ordered in ED Medications  predniSONE (DELTASONE) tablet 60 mg (60 mg Oral Given 09/18/18 1506)  ibuprofen (ADVIL,MOTRIN) tablet 600 mg (600 mg Oral Given 09/18/18 1507)     Initial Impression / Assessment and Plan / ED Course  I have reviewed the triage vital signs and the nursing notes.  Pertinent labs & imaging results that were available during my care of the patient were reviewed by me and considered in my medical decision making (see chart for details).    Pt febrile with tonsillar exudate, cervical lymphadenopathy, & dysphagia; diagnosis of strep. Treated in the Ed with steroids, NSAIDs.  Offered penicillin IM, however patient stated she wished for pills instead.  Pt appears mildly dehydrated, discussed importance of water rehydration. Presentation non concerning for PTA or infxn spread to soft tissue. No trismus or uvula deviation. Specific return precautions discussed. Pt able to drink water  in ED without difficulty with intact air way. Recommended PCP follow up.   Patient reports her mother is her legal guardian.  Her mother was contacted prior to discharge.   Final Clinical Impressions(s) / ED Diagnoses   Final diagnoses:  Strep sore throat    ED Discharge Orders  Ordered    amoxicillin (AMOXIL) 500 MG capsule  2 times daily     09/18/18 1506               Cristina GongHammond, Tayveon Lombardo W, [place] JerseyPA-C 09/18/18 1549    Maia PlanLong, Joshua G, MD 09/18/18 [phone]

## 2018-09-18 NOTE — Discharge Instructions (Addendum)
Please make sure you throw out your tooth brush.   Please take Ibuprofen (Advil, motrin) and Tylenol (acetaminophen) to relieve your pain.  You may take up to 600 MG (3 pills) of normal strength ibuprofen every 8 hours as needed.  In between doses of ibuprofen you make take tylenol, up to 1,000 mg (two extra strength pills).  Do not take more than 3,000 mg tylenol in a 24 hour period.  Please check all medication labels as many medications such as pain and cold medications may contain tylenol.  Do not drink alcohol while taking these medications.  Do not take other NSAID'S while taking ibuprofen (such as aleve or naproxen).  Please take ibuprofen with food to decrease stomach upset.  You may have diarrhea from the antibiotics.  It is very important that you continue to take the antibiotics even if you get diarrhea unless a medical professional tells you that you may stop taking them.  If you stop too early the bacteria you are being treated for will become stronger and you may need different, more powerful antibiotics that have more side effects and worsening diarrhea.  Please stay well hydrated and consider probiotics as they may decrease the severity of your diarrhea.  Please be aware that if you take any hormonal contraception (birth control pills, nexplanon, the ring, etc) that your birth control will not work while you are taking antibiotics and you need to use back up protection as directed on the birth control medication information insert.   Some common side effects include feelings of extra energy, feeling warm, increased appetite, and stomach upset.  If you are diabetic your sugars may run higher than usual.

## 2018-09-18 NOTE — ED Triage Notes (Signed)
Sore throat x 3 days

## 2019-01-16 ENCOUNTER — Encounter: Payer: Self-pay | Admitting: *Deleted

## 2019-06-26 ENCOUNTER — Other Ambulatory Visit: Payer: Self-pay | Admitting: *Deleted

## 2019-06-26 DIAGNOSIS — Z20822 Contact with and (suspected) exposure to covid-19: Secondary | ICD-10-CM

## 2019-06-28 LAB — NOVEL CORONAVIRUS, NAA: SARS-CoV-2, NAA: NOT DETECTED

## 2023-07-12 ENCOUNTER — Inpatient Hospital Stay (HOSPITAL_COMMUNITY)
Admission: AD | Admit: 2023-07-12 | Discharge: 2023-07-12 | Disposition: A | Discharge status: Home / Self Care | Payer: BC Managed Care – PPO | Attending: Obstetrics and Gynecology | Admitting: Obstetrics and Gynecology

## 2023-07-12 DIAGNOSIS — R109 Unspecified abdominal pain: Secondary | ICD-10-CM | POA: Diagnosis not present

## 2023-07-12 DIAGNOSIS — Z3202 Encounter for pregnancy test, result negative: Secondary | ICD-10-CM

## 2023-07-12 DIAGNOSIS — M545 Low back pain, unspecified: Secondary | ICD-10-CM | POA: Insufficient documentation

## 2023-07-12 LAB — HCG, QUANTITATIVE, PREGNANCY: hCG, Beta Chain, Quant, S: 2 m[IU]/mL (ref <5)

## 2023-07-12 LAB — POCT PREGNANCY, URINE: Preg Test, Ur: NEGATIVE

## 2023-07-12 NOTE — MAU Provider Note (Signed)
None     S Ms. Latasha Decker is a 27 y.o. G1P0 patient who presents to MAU today with complaint of intermittent low back pain and cramps.  Currently not having any pain. No vaginal bleeding. Had TAB three months ago, then went to [facility] two weeks ago and had a hcg level of 9 per patient. Has not been back since. Wondering how this could be when she had TAB so long ago.    O BP 121/75 (BP Location: Right Arm)   Pulse 64   Temp 97.9 F (36.6 C) (Oral)   Resp 18   Ht 5\' 5"  (1.651 m)   Wt 111.2 kg   SpO2 100%   BMI 40.79 kg/m  Physical Exam Vitals reviewed.  Constitutional:      General: She is not in acute distress.    Appearance: She is well-developed. She is not diaphoretic.  Eyes:     General: No scleral icterus. Pulmonary:     Effort: Pulmonary effort is normal. No respiratory distress.  Skin:    General: Skin is warm and dry.  Neurological:     Mental Status: She is alert.     Coordination: Coordination normal.     A Medical screening exam complete Negative urine pregnancy test  P Patient without complaint at present. UPT is negative here. Discussed that per unit policy we will obtain serum hcg level. Prefers to be notified of results by MyChart, confirmed that it is functioning prior to her discharge. Discussed possibilities include she had a repeat pregnancy that did not advance, vs false positive. Discharge from MAU in stable condition Warning signs for worsening condition that would warrant emergency follow-up discussed Patient may return to MAU as needed   Venora Maples, MD 07/12/2023 1:29 PM

## 2023-07-12 NOTE — MAU Note (Signed)
.  Charlanne Ilg is a 27 y.o. at Unknown here in MAU reporting: she has been having abdominal and stomach pain since last week. Took abortion pills on 7/26//2024. She had bleeding after and saw what looked like POC. She never had a period after her AB, so she went to [facility] two weeks ago and her Hcg was a 9. Last time she had light bleeding was on the 10/8 through 10/9 and has not had any since.  Taking Tylenol 1000 mg - Last took around 0700 or 0800.  LMP: Beginning of May - prior to AB Onset of complaint: On-going Pain score: 6-7/10 Vitals:   07/12/23 1234  BP: 121/75  Pulse: 64  Resp: 18  Temp: 97.9 F (36.6 C)  SpO2: 100%     FHT:N/A Lab orders placed from triage:  urine preg

## 2023-07-12 NOTE — Discharge Instructions (Signed)
You were seen in the MAU for a pregnancy test. Our urine pregnancy test was negative. We are checking the blood level and will contact you with the result by MyChart. If you have severe abdominal pain, heavy vaginal bleeding (soaking >1 pad/hour), or other concerning symptoms, seek medical care immediately.
